# Patient Record
Sex: Male | Born: 1940 | Race: Black or African American | Hispanic: No | Marital: Married | Smoking: Never smoker
Health system: Southern US, Community
[De-identification: ages and names within clinical notes are randomized; demographics above are authoritative.]

## PROBLEM LIST (undated history)

## (undated) ENCOUNTER — Emergency Department: Discharge status: Absent without leave | Payer: Medicare Other

## (undated) HISTORY — PX: CATARACT EXTRACTION, BILATERAL: SHX1313

---

## 1999-05-06 ENCOUNTER — Encounter: Admission: RE | Admit: 1999-05-06 | Discharge: 1999-05-06 | Payer: Self-pay | Admitting: Internal Medicine

## 1999-05-06 ENCOUNTER — Encounter: Payer: Self-pay | Admitting: Internal Medicine

## 1999-05-23 ENCOUNTER — Ambulatory Visit (HOSPITAL_COMMUNITY): Admission: RE | Admit: 1999-05-23 | Discharge: 1999-05-23 | Payer: Self-pay | Admitting: Gastroenterology

## 1999-07-14 ENCOUNTER — Encounter (HOSPITAL_BASED_OUTPATIENT_CLINIC_OR_DEPARTMENT_OTHER): Payer: Self-pay | Admitting: General Surgery

## 1999-07-18 ENCOUNTER — Inpatient Hospital Stay (HOSPITAL_COMMUNITY): Admission: RE | Admit: 1999-07-18 | Discharge: 1999-08-02 | Payer: Self-pay | Admitting: General Surgery

## 1999-07-21 ENCOUNTER — Encounter (HOSPITAL_BASED_OUTPATIENT_CLINIC_OR_DEPARTMENT_OTHER): Payer: Self-pay | Admitting: General Surgery

## 1999-07-23 ENCOUNTER — Encounter (HOSPITAL_BASED_OUTPATIENT_CLINIC_OR_DEPARTMENT_OTHER): Payer: Self-pay | Admitting: General Surgery

## 1999-07-24 ENCOUNTER — Encounter (HOSPITAL_BASED_OUTPATIENT_CLINIC_OR_DEPARTMENT_OTHER): Payer: Self-pay | Admitting: General Surgery

## 1999-07-25 ENCOUNTER — Encounter (HOSPITAL_BASED_OUTPATIENT_CLINIC_OR_DEPARTMENT_OTHER): Payer: Self-pay | Admitting: General Surgery

## 1999-07-29 ENCOUNTER — Encounter (HOSPITAL_BASED_OUTPATIENT_CLINIC_OR_DEPARTMENT_OTHER): Payer: Self-pay | Admitting: General Surgery

## 1999-08-01 ENCOUNTER — Encounter (HOSPITAL_BASED_OUTPATIENT_CLINIC_OR_DEPARTMENT_OTHER): Payer: Self-pay | Admitting: General Surgery

## 2003-12-16 ENCOUNTER — Ambulatory Visit (HOSPITAL_COMMUNITY): Admission: RE | Admit: 2003-12-16 | Discharge: 2003-12-16 | Payer: Self-pay | Admitting: Gastroenterology

## 2005-05-22 ENCOUNTER — Emergency Department (HOSPITAL_COMMUNITY): Admission: EM | Admit: 2005-05-22 | Discharge: 2005-05-23 | Payer: Self-pay | Admitting: Emergency Medicine

## 2010-05-30 ENCOUNTER — Emergency Department (HOSPITAL_COMMUNITY)
Admission: EM | Admit: 2010-05-30 | Discharge: 2010-05-31 | Disposition: A | Discharge status: Home / Self Care | Payer: Medicare Other | Attending: Emergency Medicine | Admitting: Emergency Medicine

## 2010-05-30 ENCOUNTER — Other Ambulatory Visit: Payer: Self-pay | Admitting: Internal Medicine

## 2010-05-30 DIAGNOSIS — R059 Cough, unspecified: Secondary | ICD-10-CM | POA: Insufficient documentation

## 2010-05-30 DIAGNOSIS — J3489 Other specified disorders of nose and nasal sinuses: Secondary | ICD-10-CM | POA: Insufficient documentation

## 2010-05-30 DIAGNOSIS — E785 Hyperlipidemia, unspecified: Secondary | ICD-10-CM | POA: Insufficient documentation

## 2010-05-30 DIAGNOSIS — R791 Abnormal coagulation profile: Secondary | ICD-10-CM | POA: Insufficient documentation

## 2010-05-30 DIAGNOSIS — F29 Unspecified psychosis not due to a substance or known physiological condition: Secondary | ICD-10-CM | POA: Insufficient documentation

## 2010-05-30 DIAGNOSIS — R05 Cough: Secondary | ICD-10-CM | POA: Insufficient documentation

## 2010-05-30 DIAGNOSIS — E039 Hypothyroidism, unspecified: Secondary | ICD-10-CM | POA: Insufficient documentation

## 2010-05-30 DIAGNOSIS — R079 Chest pain, unspecified: Secondary | ICD-10-CM | POA: Insufficient documentation

## 2010-05-30 DIAGNOSIS — E119 Type 2 diabetes mellitus without complications: Secondary | ICD-10-CM | POA: Insufficient documentation

## 2010-05-30 DIAGNOSIS — I10 Essential (primary) hypertension: Secondary | ICD-10-CM | POA: Insufficient documentation

## 2010-05-30 DIAGNOSIS — Z79899 Other long term (current) drug therapy: Secondary | ICD-10-CM | POA: Insufficient documentation

## 2010-05-30 LAB — DIFFERENTIAL
Basophils Absolute: 0 10*3/uL (ref 0.0–0.1)
Basophils Relative: 0 % (ref 0–1)
Eosinophils Absolute: 0.1 10*3/uL (ref 0.0–0.7)
Eosinophils Relative: 1 % (ref 0–5)
Lymphocytes Relative: 22 % (ref 12–46)
Lymphs Abs: 2.7 10*3/uL (ref 0.7–4.0)
Monocytes Absolute: 1.3 10*3/uL — ABNORMAL HIGH (ref 0.1–1.0)
Monocytes Relative: 11 % (ref 3–12)
Neutro Abs: 7.8 10*3/uL — ABNORMAL HIGH (ref 1.7–7.7)
Neutrophils Relative %: 66 % (ref 43–77)

## 2010-05-30 LAB — POCT CARDIAC MARKERS
CKMB, poc: 1.2 ng/mL (ref 1.0–8.0)
Myoglobin, poc: 97.3 ng/mL (ref 12–200)
Troponin i, poc: <0.05 ng/mL (ref 0.00–0.09)

## 2010-05-30 LAB — CBC
HCT: 36.3 % — ABNORMAL LOW (ref 39.0–52.0)
Hemoglobin: 12.7 g/dL — ABNORMAL LOW (ref 13.0–17.0)
MCH: 30.6 pg (ref 26.0–34.0)
MCHC: 35 g/dL (ref 30.0–36.0)
MCV: 87.5 fL (ref 78.0–100.0)
Platelets: 318 10*3/uL (ref 150–400)
RBC: 4.15 MIL/uL — ABNORMAL LOW (ref 4.22–5.81)
RDW: 11.6 % (ref 11.5–15.5)
WBC: 11.8 10*3/uL — ABNORMAL HIGH (ref 4.0–10.5)

## 2010-05-30 LAB — D-DIMER, QUANTITATIVE: D-Dimer, Quant: 7.85 ug/mL-FEU — ABNORMAL HIGH (ref 0.00–0.48)

## 2010-05-31 ENCOUNTER — Emergency Department (HOSPITAL_COMMUNITY): Payer: Medicare Other

## 2010-05-31 ENCOUNTER — Encounter (HOSPITAL_COMMUNITY): Payer: Self-pay | Admitting: Radiology

## 2010-05-31 LAB — BASIC METABOLIC PANEL WITH GFR
BUN: 12 mg/dL (ref 6–23)
CO2: 26 meq/L (ref 19–32)
Calcium: 9.2 mg/dL (ref 8.4–10.5)
Chloride: 97 meq/L (ref 96–112)
Creatinine, Ser: 1.11 mg/dL (ref 0.4–1.5)
GFR calc non Af Amer: >60 mL/min
Glucose, Bld: 157 mg/dL — ABNORMAL HIGH (ref 70–99)
Potassium: 3.8 meq/L (ref 3.5–5.1)
Sodium: 132 meq/L — ABNORMAL LOW (ref 135–145)

## 2010-05-31 MED ORDER — IOHEXOL 300 MG/ML  SOLN
100.0000 mL | Freq: Once | INTRAMUSCULAR | Status: AC | PRN
  Administered 2010-05-31: 85 mL via INTRAVENOUS

## 2011-05-22 DIAGNOSIS — H359 Unspecified retinal disorder: Secondary | ICD-10-CM | POA: Diagnosis not present

## 2011-05-22 DIAGNOSIS — E11311 Type 2 diabetes mellitus with unspecified diabetic retinopathy with macular edema: Secondary | ICD-10-CM | POA: Diagnosis not present

## 2011-05-22 DIAGNOSIS — E1139 Type 2 diabetes mellitus with other diabetic ophthalmic complication: Secondary | ICD-10-CM | POA: Diagnosis not present

## 2011-05-22 DIAGNOSIS — E11339 Type 2 diabetes mellitus with moderate nonproliferative diabetic retinopathy without macular edema: Secondary | ICD-10-CM | POA: Diagnosis not present

## 2011-07-03 DIAGNOSIS — E1139 Type 2 diabetes mellitus with other diabetic ophthalmic complication: Secondary | ICD-10-CM | POA: Diagnosis not present

## 2011-07-03 DIAGNOSIS — H2510 Age-related nuclear cataract, unspecified eye: Secondary | ICD-10-CM | POA: Diagnosis not present

## 2011-07-03 DIAGNOSIS — H52209 Unspecified astigmatism, unspecified eye: Secondary | ICD-10-CM | POA: Diagnosis not present

## 2011-07-03 DIAGNOSIS — E11319 Type 2 diabetes mellitus with unspecified diabetic retinopathy without macular edema: Secondary | ICD-10-CM | POA: Diagnosis not present

## 2011-07-10 DIAGNOSIS — E039 Hypothyroidism, unspecified: Secondary | ICD-10-CM | POA: Diagnosis not present

## 2011-07-10 DIAGNOSIS — E119 Type 2 diabetes mellitus without complications: Secondary | ICD-10-CM | POA: Diagnosis not present

## 2011-07-10 DIAGNOSIS — I10 Essential (primary) hypertension: Secondary | ICD-10-CM | POA: Diagnosis not present

## 2011-09-18 DIAGNOSIS — E782 Mixed hyperlipidemia: Secondary | ICD-10-CM | POA: Diagnosis not present

## 2011-09-18 DIAGNOSIS — Z1331 Encounter for screening for depression: Secondary | ICD-10-CM | POA: Diagnosis not present

## 2011-09-18 DIAGNOSIS — E039 Hypothyroidism, unspecified: Secondary | ICD-10-CM | POA: Diagnosis not present

## 2011-09-18 DIAGNOSIS — Z79899 Other long term (current) drug therapy: Secondary | ICD-10-CM | POA: Diagnosis not present

## 2011-09-18 DIAGNOSIS — E11339 Type 2 diabetes mellitus with moderate nonproliferative diabetic retinopathy without macular edema: Secondary | ICD-10-CM | POA: Diagnosis not present

## 2011-09-18 DIAGNOSIS — E1139 Type 2 diabetes mellitus with other diabetic ophthalmic complication: Secondary | ICD-10-CM | POA: Diagnosis not present

## 2011-09-18 DIAGNOSIS — J309 Allergic rhinitis, unspecified: Secondary | ICD-10-CM | POA: Diagnosis not present

## 2011-09-18 DIAGNOSIS — Z0000 Encounter for general adult medical examination without abnormal findings: Secondary | ICD-10-CM | POA: Diagnosis not present

## 2011-09-18 DIAGNOSIS — E538 Deficiency of other specified B group vitamins: Secondary | ICD-10-CM | POA: Diagnosis not present

## 2011-09-18 DIAGNOSIS — E1165 Type 2 diabetes mellitus with hyperglycemia: Secondary | ICD-10-CM | POA: Diagnosis not present

## 2011-09-18 DIAGNOSIS — E559 Vitamin D deficiency, unspecified: Secondary | ICD-10-CM | POA: Diagnosis not present

## 2012-02-12 DIAGNOSIS — E1139 Type 2 diabetes mellitus with other diabetic ophthalmic complication: Secondary | ICD-10-CM | POA: Diagnosis not present

## 2012-02-12 DIAGNOSIS — E11311 Type 2 diabetes mellitus with unspecified diabetic retinopathy with macular edema: Secondary | ICD-10-CM | POA: Diagnosis not present

## 2012-02-12 DIAGNOSIS — H35359 Cystoid macular degeneration, unspecified eye: Secondary | ICD-10-CM | POA: Diagnosis not present

## 2012-02-12 DIAGNOSIS — H359 Unspecified retinal disorder: Secondary | ICD-10-CM | POA: Diagnosis not present

## 2012-07-03 DIAGNOSIS — E1139 Type 2 diabetes mellitus with other diabetic ophthalmic complication: Secondary | ICD-10-CM | POA: Diagnosis not present

## 2012-07-03 DIAGNOSIS — H2510 Age-related nuclear cataract, unspecified eye: Secondary | ICD-10-CM | POA: Diagnosis not present

## 2012-07-03 DIAGNOSIS — H52209 Unspecified astigmatism, unspecified eye: Secondary | ICD-10-CM | POA: Diagnosis not present

## 2012-07-03 DIAGNOSIS — E11319 Type 2 diabetes mellitus with unspecified diabetic retinopathy without macular edema: Secondary | ICD-10-CM | POA: Diagnosis not present

## 2012-08-28 IMAGING — CT CT ANGIO CHEST
2 of 7 series · 19 of 36 positions shown · IV contrast (APPLIED)
Comparison: Plain film chest 05/23/2005.

CLINICAL DATA: Cough.  Elevated D-dimer.

CT ANGIOGRAPHY CHEST WITH CONTRAST
TECHNIQUE: Multidetector CT imaging of the chest was performed
using the standard protocol during bolus administration of
intravenous contrast.  Multiplanar CT image reconstructions
including MIPs were obtained to evaluate the vascular anatomy.
Contrast:  100 ml [id number].

[Series 8: pulm embolism 1.0 b25f thins · axial · 0.63mm/px · z∈[+16,+271]mm · 18 of 285 slices shown]
[im 15/285  lung]
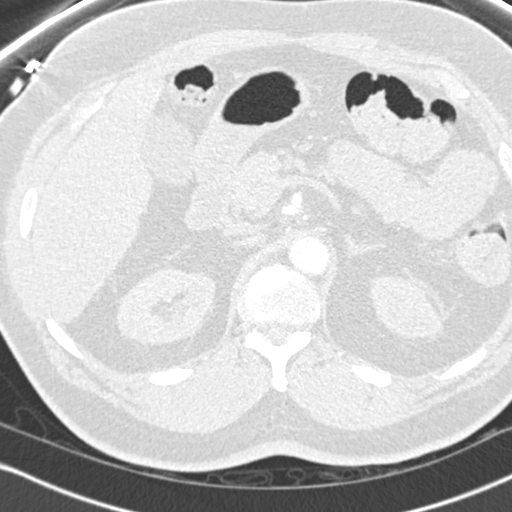
[im 29/285  mediastinal]
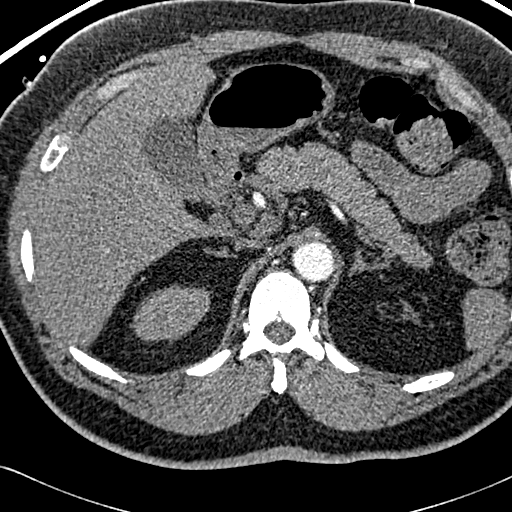
[im 43/285  lung]
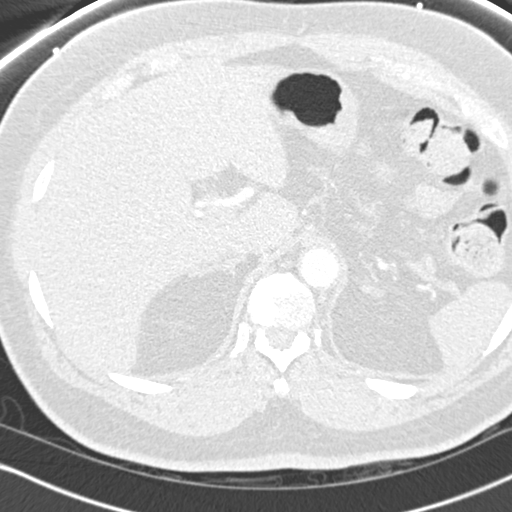
[im 57/285  mediastinal]
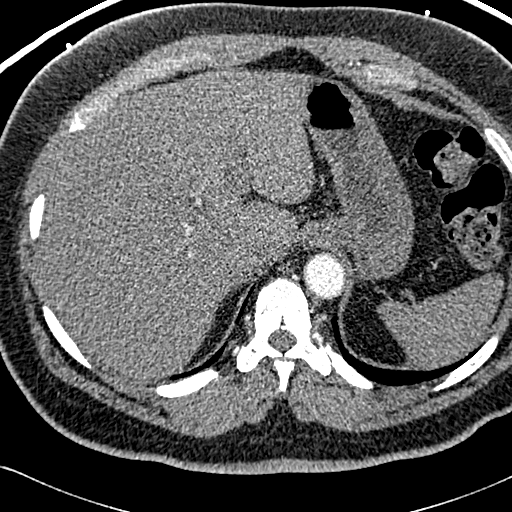
[im 72/285  lung]
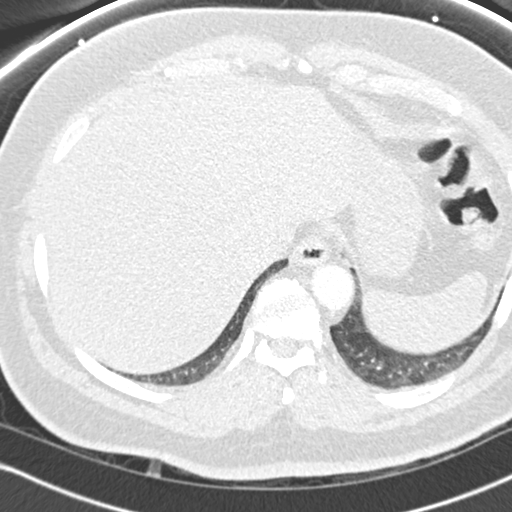
[im 86/285  mediastinal]
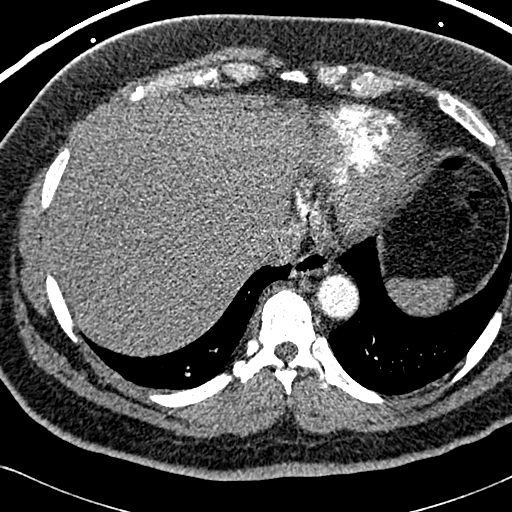
[im 100/285  lung]
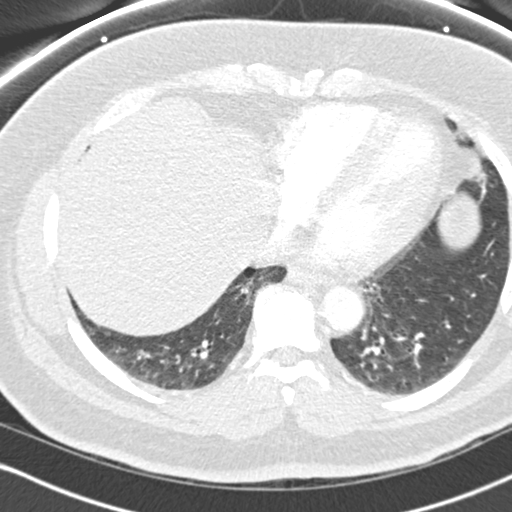
[im 114/285  mediastinal]
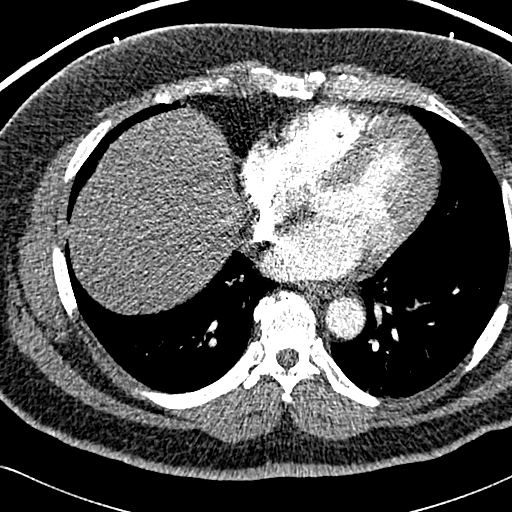
[im 128/285  lung]
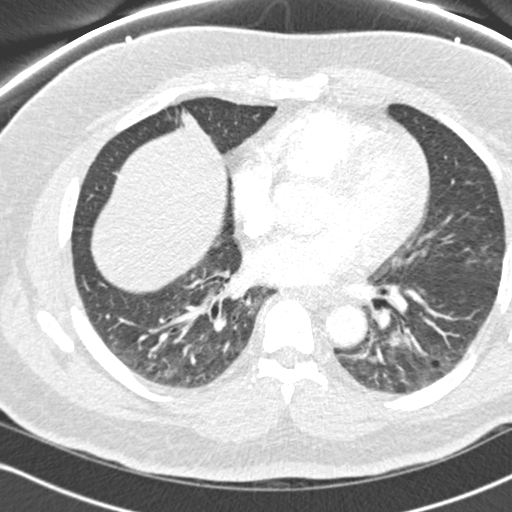
[im 157/285  mediastinal]
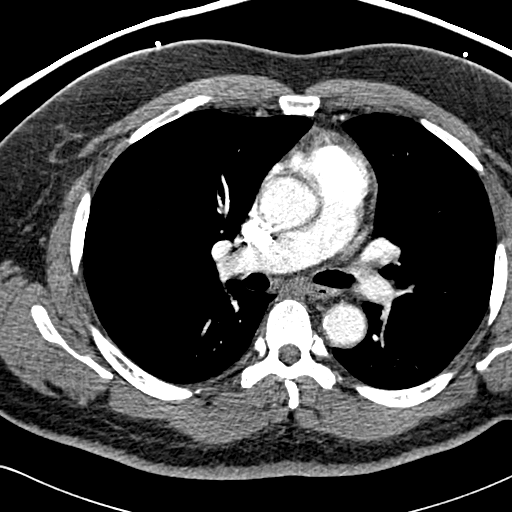
[im 171/285  lung]
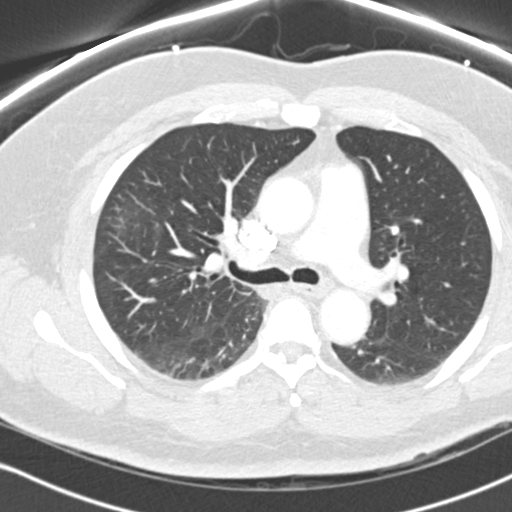
[im 185/285  mediastinal]
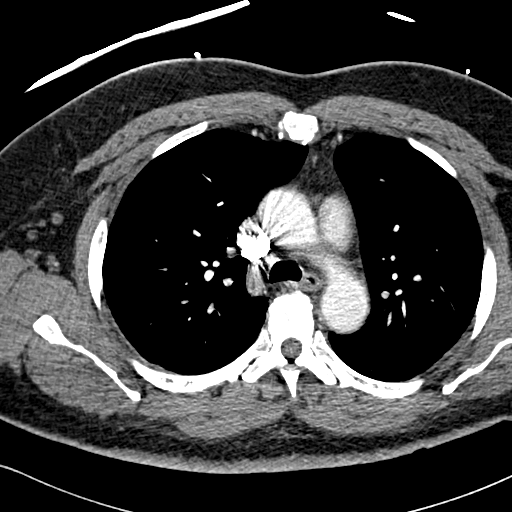
[im 199/285  lung]
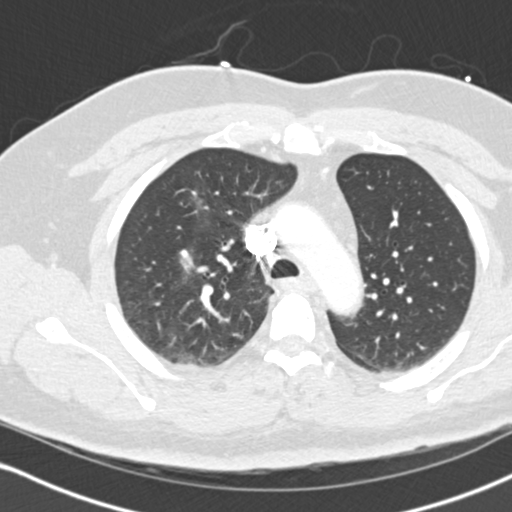
[im 214/285  mediastinal]
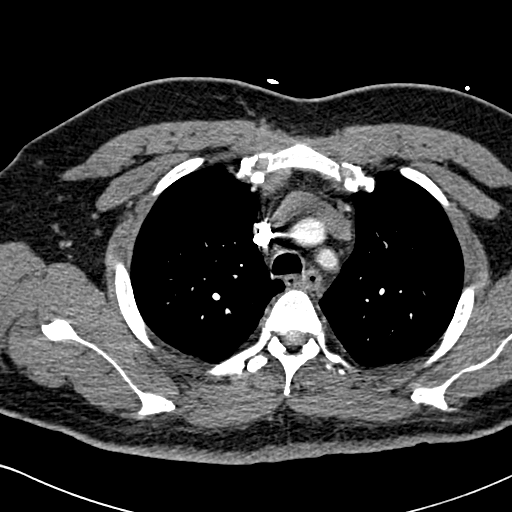
[im 228/285  lung]
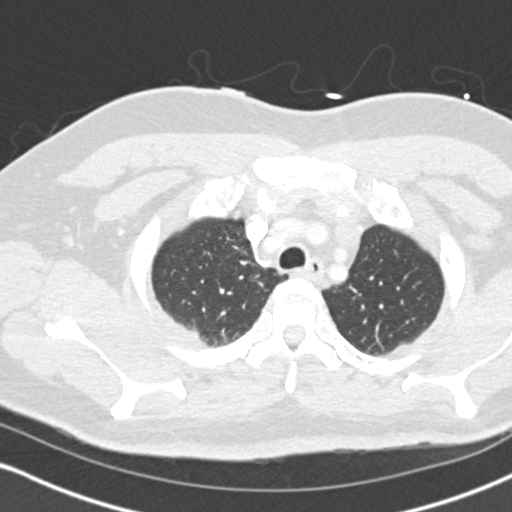
[im 242/285  mediastinal]
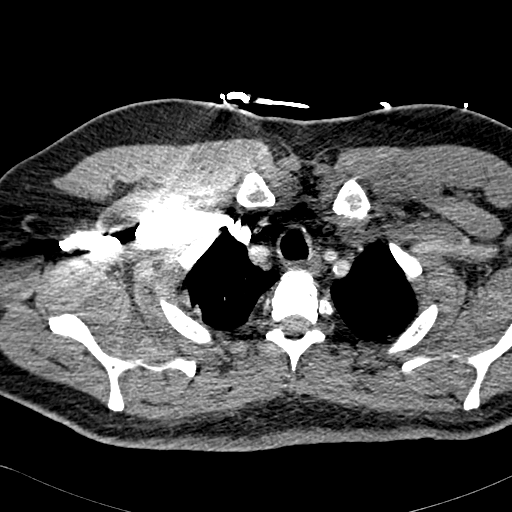
[im 256/285  lung]
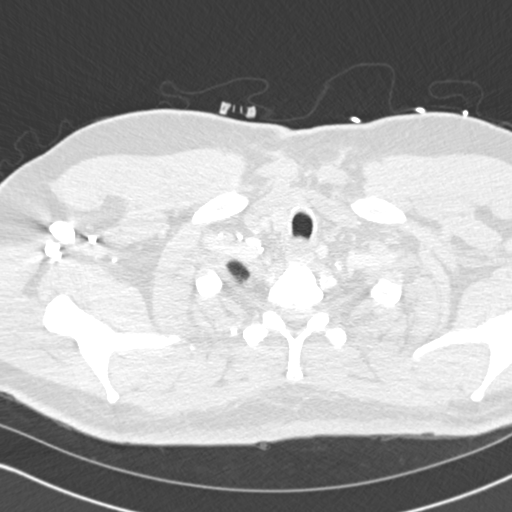
[im 270/285  mediastinal]
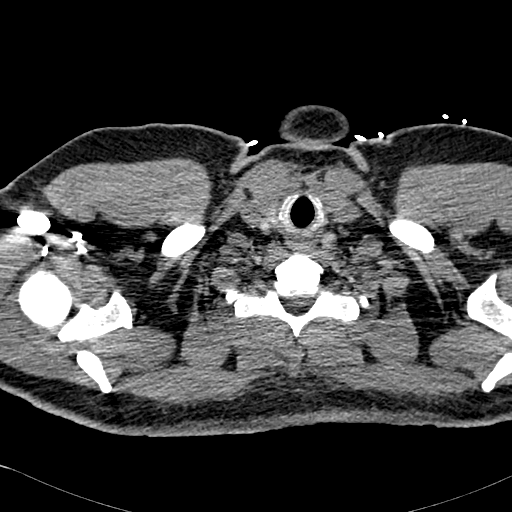

[Series 602: cor · coronal · 0.63mm/px · 1 of 106 slices shown]
[im 53/106  mediastinal]
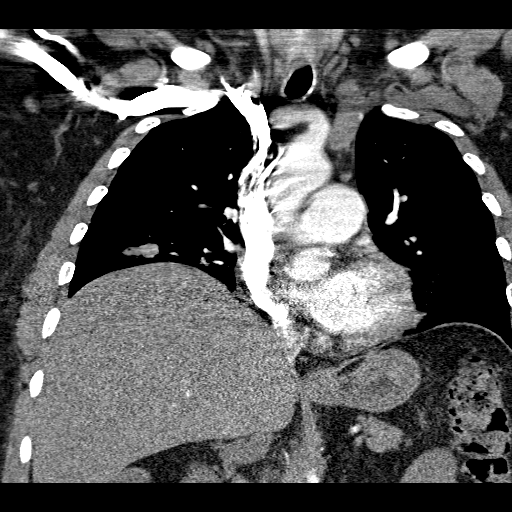

[19 of 36 positions shown; findings below may reference images not displayed]

FINDINGS: No pulmonary embolus is identified.  No pleural or
pericardial effusion.  No axillary, hilar or mediastinal
lymphadenopathy.  Heart size normal.  Lungs demonstrate a 0.5 cm
nodule the right upper lobe on image 31.  There is also some patchy
airspace disease in the right upper lobe.  Dependent atelectasis is
identified.  Mucoid impaction of a bronchus in the right middle
lobe is seen.

Incidentally imaged upper abdomen is unremarkable. No focal bony
abnormality.

Review of the MIP images confirms the above findings.
IMPRESSION: 1.  Negative for pulmonary embolus.
2.  Patchy right upper lobe airspace disease worrisome for
bronchopneumonia.
3.  0.5 cm right upper lobe pulmonary nodule. If the patient is at
high risk for bronchogenic carcinoma, follow-up chest CT at 6-12
months is recommended.  If the patient is at low risk for
bronchogenic carcinoma, follow-up chest CT at 12 months is
recommended.  This recommendation follows the consensus statement:
Guidelines for Management of Small Pulmonary Nodules Detected on CT
Scans: A Statement from the [HOSPITAL] as published in
[URL]
4.  Mucoid impaction of a right middle lobe bronchus noted.

## 2012-09-17 DIAGNOSIS — E1139 Type 2 diabetes mellitus with other diabetic ophthalmic complication: Secondary | ICD-10-CM | POA: Diagnosis not present

## 2012-09-17 DIAGNOSIS — E11311 Type 2 diabetes mellitus with unspecified diabetic retinopathy with macular edema: Secondary | ICD-10-CM | POA: Diagnosis not present

## 2012-09-17 DIAGNOSIS — E11339 Type 2 diabetes mellitus with moderate nonproliferative diabetic retinopathy without macular edema: Secondary | ICD-10-CM | POA: Diagnosis not present

## 2012-09-17 DIAGNOSIS — H35359 Cystoid macular degeneration, unspecified eye: Secondary | ICD-10-CM | POA: Diagnosis not present

## 2012-09-24 DIAGNOSIS — E538 Deficiency of other specified B group vitamins: Secondary | ICD-10-CM | POA: Diagnosis not present

## 2012-09-24 DIAGNOSIS — Z1331 Encounter for screening for depression: Secondary | ICD-10-CM | POA: Diagnosis not present

## 2012-09-24 DIAGNOSIS — E1165 Type 2 diabetes mellitus with hyperglycemia: Secondary | ICD-10-CM | POA: Diagnosis not present

## 2012-09-24 DIAGNOSIS — J309 Allergic rhinitis, unspecified: Secondary | ICD-10-CM | POA: Diagnosis not present

## 2012-09-24 DIAGNOSIS — E782 Mixed hyperlipidemia: Secondary | ICD-10-CM | POA: Diagnosis not present

## 2012-09-24 DIAGNOSIS — I10 Essential (primary) hypertension: Secondary | ICD-10-CM | POA: Diagnosis not present

## 2012-09-24 DIAGNOSIS — E11339 Type 2 diabetes mellitus with moderate nonproliferative diabetic retinopathy without macular edema: Secondary | ICD-10-CM | POA: Diagnosis not present

## 2012-09-24 DIAGNOSIS — Z0000 Encounter for general adult medical examination without abnormal findings: Secondary | ICD-10-CM | POA: Diagnosis not present

## 2012-09-24 DIAGNOSIS — Z79899 Other long term (current) drug therapy: Secondary | ICD-10-CM | POA: Diagnosis not present

## 2012-09-24 DIAGNOSIS — E1139 Type 2 diabetes mellitus with other diabetic ophthalmic complication: Secondary | ICD-10-CM | POA: Diagnosis not present

## 2012-09-24 DIAGNOSIS — E039 Hypothyroidism, unspecified: Secondary | ICD-10-CM | POA: Diagnosis not present

## 2012-09-24 DIAGNOSIS — E559 Vitamin D deficiency, unspecified: Secondary | ICD-10-CM | POA: Diagnosis not present

## 2012-09-24 DIAGNOSIS — E669 Obesity, unspecified: Secondary | ICD-10-CM | POA: Diagnosis not present

## 2012-10-23 ENCOUNTER — Other Ambulatory Visit: Payer: Self-pay | Admitting: Gastroenterology

## 2012-10-23 DIAGNOSIS — Z86010 Personal history of colonic polyps: Secondary | ICD-10-CM | POA: Diagnosis not present

## 2012-10-23 DIAGNOSIS — K620 Anal polyp: Secondary | ICD-10-CM | POA: Diagnosis not present

## 2012-10-23 DIAGNOSIS — D126 Benign neoplasm of colon, unspecified: Secondary | ICD-10-CM | POA: Diagnosis not present

## 2012-10-23 DIAGNOSIS — K5730 Diverticulosis of large intestine without perforation or abscess without bleeding: Secondary | ICD-10-CM | POA: Diagnosis not present

## 2012-10-23 DIAGNOSIS — Z09 Encounter for follow-up examination after completed treatment for conditions other than malignant neoplasm: Secondary | ICD-10-CM | POA: Diagnosis not present

## 2012-10-23 DIAGNOSIS — K621 Rectal polyp: Secondary | ICD-10-CM | POA: Diagnosis not present

## 2013-04-17 DIAGNOSIS — E11311 Type 2 diabetes mellitus with unspecified diabetic retinopathy with macular edema: Secondary | ICD-10-CM | POA: Diagnosis not present

## 2013-04-17 DIAGNOSIS — E1139 Type 2 diabetes mellitus with other diabetic ophthalmic complication: Secondary | ICD-10-CM | POA: Diagnosis not present

## 2013-04-17 DIAGNOSIS — H359 Unspecified retinal disorder: Secondary | ICD-10-CM | POA: Diagnosis not present

## 2013-04-17 DIAGNOSIS — H35359 Cystoid macular degeneration, unspecified eye: Secondary | ICD-10-CM | POA: Diagnosis not present

## 2013-04-17 DIAGNOSIS — E11339 Type 2 diabetes mellitus with moderate nonproliferative diabetic retinopathy without macular edema: Secondary | ICD-10-CM | POA: Diagnosis not present

## 2013-07-08 DIAGNOSIS — E1139 Type 2 diabetes mellitus with other diabetic ophthalmic complication: Secondary | ICD-10-CM | POA: Diagnosis not present

## 2013-07-08 DIAGNOSIS — H2510 Age-related nuclear cataract, unspecified eye: Secondary | ICD-10-CM | POA: Diagnosis not present

## 2013-07-08 DIAGNOSIS — H52209 Unspecified astigmatism, unspecified eye: Secondary | ICD-10-CM | POA: Diagnosis not present

## 2013-07-08 DIAGNOSIS — E11311 Type 2 diabetes mellitus with unspecified diabetic retinopathy with macular edema: Secondary | ICD-10-CM | POA: Diagnosis not present

## 2013-08-15 DIAGNOSIS — R142 Eructation: Secondary | ICD-10-CM | POA: Diagnosis not present

## 2013-08-15 DIAGNOSIS — R141 Gas pain: Secondary | ICD-10-CM | POA: Diagnosis not present

## 2013-11-04 DIAGNOSIS — E1165 Type 2 diabetes mellitus with hyperglycemia: Secondary | ICD-10-CM | POA: Diagnosis not present

## 2013-11-04 DIAGNOSIS — E039 Hypothyroidism, unspecified: Secondary | ICD-10-CM | POA: Diagnosis not present

## 2013-11-04 DIAGNOSIS — Z0000 Encounter for general adult medical examination without abnormal findings: Secondary | ICD-10-CM | POA: Diagnosis not present

## 2013-11-04 DIAGNOSIS — E1139 Type 2 diabetes mellitus with other diabetic ophthalmic complication: Secondary | ICD-10-CM | POA: Diagnosis not present

## 2013-11-04 DIAGNOSIS — Z1331 Encounter for screening for depression: Secondary | ICD-10-CM | POA: Diagnosis not present

## 2013-11-04 DIAGNOSIS — Z23 Encounter for immunization: Secondary | ICD-10-CM | POA: Diagnosis not present

## 2013-11-04 DIAGNOSIS — Z6833 Body mass index (BMI) 33.0-33.9, adult: Secondary | ICD-10-CM | POA: Diagnosis not present

## 2013-11-04 DIAGNOSIS — E538 Deficiency of other specified B group vitamins: Secondary | ICD-10-CM | POA: Diagnosis not present

## 2013-11-04 DIAGNOSIS — E11339 Type 2 diabetes mellitus with moderate nonproliferative diabetic retinopathy without macular edema: Secondary | ICD-10-CM | POA: Diagnosis not present

## 2013-11-04 DIAGNOSIS — E782 Mixed hyperlipidemia: Secondary | ICD-10-CM | POA: Diagnosis not present

## 2013-11-04 DIAGNOSIS — E559 Vitamin D deficiency, unspecified: Secondary | ICD-10-CM | POA: Diagnosis not present

## 2013-11-04 DIAGNOSIS — N529 Male erectile dysfunction, unspecified: Secondary | ICD-10-CM | POA: Diagnosis not present

## 2013-11-04 DIAGNOSIS — I10 Essential (primary) hypertension: Secondary | ICD-10-CM | POA: Diagnosis not present

## 2013-11-06 DIAGNOSIS — H2510 Age-related nuclear cataract, unspecified eye: Secondary | ICD-10-CM | POA: Diagnosis not present

## 2013-11-06 DIAGNOSIS — H2589 Other age-related cataract: Secondary | ICD-10-CM | POA: Diagnosis not present

## 2013-12-04 DIAGNOSIS — H2589 Other age-related cataract: Secondary | ICD-10-CM | POA: Diagnosis not present

## 2013-12-04 DIAGNOSIS — H2510 Age-related nuclear cataract, unspecified eye: Secondary | ICD-10-CM | POA: Diagnosis not present

## 2013-12-12 DIAGNOSIS — H35351 Cystoid macular degeneration, right eye: Secondary | ICD-10-CM | POA: Diagnosis not present

## 2013-12-15 DIAGNOSIS — E11339 Type 2 diabetes mellitus with moderate nonproliferative diabetic retinopathy without macular edema: Secondary | ICD-10-CM | POA: Diagnosis not present

## 2013-12-15 DIAGNOSIS — E11359 Type 2 diabetes mellitus with proliferative diabetic retinopathy without macular edema: Secondary | ICD-10-CM | POA: Diagnosis not present

## 2013-12-20 DIAGNOSIS — Z23 Encounter for immunization: Secondary | ICD-10-CM | POA: Diagnosis not present

## 2014-01-29 DIAGNOSIS — E11311 Type 2 diabetes mellitus with unspecified diabetic retinopathy with macular edema: Secondary | ICD-10-CM | POA: Diagnosis not present

## 2014-03-05 DIAGNOSIS — M6248 Contracture of muscle, other site: Secondary | ICD-10-CM | POA: Diagnosis not present

## 2014-07-30 DIAGNOSIS — H35351 Cystoid macular degeneration, right eye: Secondary | ICD-10-CM | POA: Diagnosis not present

## 2014-07-30 DIAGNOSIS — E11339 Type 2 diabetes mellitus with moderate nonproliferative diabetic retinopathy without macular edema: Secondary | ICD-10-CM | POA: Diagnosis not present

## 2014-07-30 DIAGNOSIS — H359 Unspecified retinal disorder: Secondary | ICD-10-CM | POA: Diagnosis not present

## 2014-11-26 DIAGNOSIS — L309 Dermatitis, unspecified: Secondary | ICD-10-CM | POA: Diagnosis not present

## 2014-11-26 DIAGNOSIS — Z79899 Other long term (current) drug therapy: Secondary | ICD-10-CM | POA: Diagnosis not present

## 2014-11-26 DIAGNOSIS — E559 Vitamin D deficiency, unspecified: Secondary | ICD-10-CM | POA: Diagnosis not present

## 2014-11-26 DIAGNOSIS — E039 Hypothyroidism, unspecified: Secondary | ICD-10-CM | POA: Diagnosis not present

## 2014-11-26 DIAGNOSIS — Z23 Encounter for immunization: Secondary | ICD-10-CM | POA: Diagnosis not present

## 2014-11-26 DIAGNOSIS — Z0001 Encounter for general adult medical examination with abnormal findings: Secondary | ICD-10-CM | POA: Diagnosis not present

## 2014-11-26 DIAGNOSIS — E11331 Type 2 diabetes mellitus with moderate nonproliferative diabetic retinopathy with macular edema: Secondary | ICD-10-CM | POA: Diagnosis not present

## 2014-11-26 DIAGNOSIS — I10 Essential (primary) hypertension: Secondary | ICD-10-CM | POA: Diagnosis not present

## 2014-11-26 DIAGNOSIS — E1165 Type 2 diabetes mellitus with hyperglycemia: Secondary | ICD-10-CM | POA: Diagnosis not present

## 2014-11-26 DIAGNOSIS — D81819 Biotin-dependent carboxylase deficiency, unspecified: Secondary | ICD-10-CM | POA: Diagnosis not present

## 2014-11-26 DIAGNOSIS — Z1389 Encounter for screening for other disorder: Secondary | ICD-10-CM | POA: Diagnosis not present

## 2014-11-26 DIAGNOSIS — E782 Mixed hyperlipidemia: Secondary | ICD-10-CM | POA: Diagnosis not present

## 2015-04-14 DIAGNOSIS — E113493 Type 2 diabetes mellitus with severe nonproliferative diabetic retinopathy without macular edema, bilateral: Secondary | ICD-10-CM | POA: Diagnosis not present

## 2015-04-14 DIAGNOSIS — H35043 Retinal micro-aneurysms, unspecified, bilateral: Secondary | ICD-10-CM | POA: Diagnosis not present

## 2015-04-14 DIAGNOSIS — H3563 Retinal hemorrhage, bilateral: Secondary | ICD-10-CM | POA: Diagnosis not present

## 2015-04-14 DIAGNOSIS — H359 Unspecified retinal disorder: Secondary | ICD-10-CM | POA: Diagnosis not present

## 2015-07-12 DIAGNOSIS — Z961 Presence of intraocular lens: Secondary | ICD-10-CM | POA: Diagnosis not present

## 2015-07-12 DIAGNOSIS — E113393 Type 2 diabetes mellitus with moderate nonproliferative diabetic retinopathy without macular edema, bilateral: Secondary | ICD-10-CM | POA: Diagnosis not present

## 2015-07-12 DIAGNOSIS — H52203 Unspecified astigmatism, bilateral: Secondary | ICD-10-CM | POA: Diagnosis not present

## 2015-11-04 DIAGNOSIS — R21 Rash and other nonspecific skin eruption: Secondary | ICD-10-CM | POA: Diagnosis not present

## 2015-12-06 DIAGNOSIS — Z23 Encounter for immunization: Secondary | ICD-10-CM | POA: Diagnosis not present

## 2015-12-06 DIAGNOSIS — L309 Dermatitis, unspecified: Secondary | ICD-10-CM | POA: Diagnosis not present

## 2015-12-06 DIAGNOSIS — D81819 Biotin-dependent carboxylase deficiency, unspecified: Secondary | ICD-10-CM | POA: Diagnosis not present

## 2015-12-06 DIAGNOSIS — Z79899 Other long term (current) drug therapy: Secondary | ICD-10-CM | POA: Diagnosis not present

## 2015-12-06 DIAGNOSIS — Z7984 Long term (current) use of oral hypoglycemic drugs: Secondary | ICD-10-CM | POA: Diagnosis not present

## 2015-12-06 DIAGNOSIS — E559 Vitamin D deficiency, unspecified: Secondary | ICD-10-CM | POA: Diagnosis not present

## 2015-12-06 DIAGNOSIS — E039 Hypothyroidism, unspecified: Secondary | ICD-10-CM | POA: Diagnosis not present

## 2015-12-06 DIAGNOSIS — Z1389 Encounter for screening for other disorder: Secondary | ICD-10-CM | POA: Diagnosis not present

## 2015-12-06 DIAGNOSIS — Z0001 Encounter for general adult medical examination with abnormal findings: Secondary | ICD-10-CM | POA: Diagnosis not present

## 2015-12-06 DIAGNOSIS — E1165 Type 2 diabetes mellitus with hyperglycemia: Secondary | ICD-10-CM | POA: Diagnosis not present

## 2015-12-06 DIAGNOSIS — N529 Male erectile dysfunction, unspecified: Secondary | ICD-10-CM | POA: Diagnosis not present

## 2015-12-06 DIAGNOSIS — E782 Mixed hyperlipidemia: Secondary | ICD-10-CM | POA: Diagnosis not present

## 2015-12-06 DIAGNOSIS — I10 Essential (primary) hypertension: Secondary | ICD-10-CM | POA: Diagnosis not present

## 2016-01-12 DIAGNOSIS — H3563 Retinal hemorrhage, bilateral: Secondary | ICD-10-CM | POA: Diagnosis not present

## 2016-01-12 DIAGNOSIS — E113493 Type 2 diabetes mellitus with severe nonproliferative diabetic retinopathy without macular edema, bilateral: Secondary | ICD-10-CM | POA: Diagnosis not present

## 2016-01-12 DIAGNOSIS — H35043 Retinal micro-aneurysms, unspecified, bilateral: Secondary | ICD-10-CM | POA: Diagnosis not present

## 2016-01-12 DIAGNOSIS — H359 Unspecified retinal disorder: Secondary | ICD-10-CM | POA: Diagnosis not present

## 2016-03-09 DIAGNOSIS — R062 Wheezing: Secondary | ICD-10-CM | POA: Diagnosis not present

## 2016-03-09 DIAGNOSIS — J22 Unspecified acute lower respiratory infection: Secondary | ICD-10-CM | POA: Diagnosis not present

## 2016-04-06 DIAGNOSIS — J3089 Other allergic rhinitis: Secondary | ICD-10-CM | POA: Diagnosis not present

## 2016-06-20 DIAGNOSIS — S335XXA Sprain of ligaments of lumbar spine, initial encounter: Secondary | ICD-10-CM | POA: Diagnosis not present

## 2016-06-20 DIAGNOSIS — M9904 Segmental and somatic dysfunction of sacral region: Secondary | ICD-10-CM | POA: Diagnosis not present

## 2016-06-21 DIAGNOSIS — S335XXA Sprain of ligaments of lumbar spine, initial encounter: Secondary | ICD-10-CM | POA: Diagnosis not present

## 2016-06-21 DIAGNOSIS — M9904 Segmental and somatic dysfunction of sacral region: Secondary | ICD-10-CM | POA: Diagnosis not present

## 2016-06-23 DIAGNOSIS — S335XXA Sprain of ligaments of lumbar spine, initial encounter: Secondary | ICD-10-CM | POA: Diagnosis not present

## 2016-06-23 DIAGNOSIS — M9904 Segmental and somatic dysfunction of sacral region: Secondary | ICD-10-CM | POA: Diagnosis not present

## 2016-06-26 DIAGNOSIS — M9904 Segmental and somatic dysfunction of sacral region: Secondary | ICD-10-CM | POA: Diagnosis not present

## 2016-06-26 DIAGNOSIS — S335XXA Sprain of ligaments of lumbar spine, initial encounter: Secondary | ICD-10-CM | POA: Diagnosis not present

## 2016-06-30 DIAGNOSIS — S335XXA Sprain of ligaments of lumbar spine, initial encounter: Secondary | ICD-10-CM | POA: Diagnosis not present

## 2016-06-30 DIAGNOSIS — M9904 Segmental and somatic dysfunction of sacral region: Secondary | ICD-10-CM | POA: Diagnosis not present

## 2016-07-18 DIAGNOSIS — H52203 Unspecified astigmatism, bilateral: Secondary | ICD-10-CM | POA: Diagnosis not present

## 2016-07-18 DIAGNOSIS — Z961 Presence of intraocular lens: Secondary | ICD-10-CM | POA: Diagnosis not present

## 2016-07-18 DIAGNOSIS — E113213 Type 2 diabetes mellitus with mild nonproliferative diabetic retinopathy with macular edema, bilateral: Secondary | ICD-10-CM | POA: Diagnosis not present

## 2016-07-28 DIAGNOSIS — S335XXA Sprain of ligaments of lumbar spine, initial encounter: Secondary | ICD-10-CM | POA: Diagnosis not present

## 2016-07-28 DIAGNOSIS — M9904 Segmental and somatic dysfunction of sacral region: Secondary | ICD-10-CM | POA: Diagnosis not present

## 2016-10-18 DIAGNOSIS — H359 Unspecified retinal disorder: Secondary | ICD-10-CM | POA: Diagnosis not present

## 2016-10-18 DIAGNOSIS — H3563 Retinal hemorrhage, bilateral: Secondary | ICD-10-CM | POA: Diagnosis not present

## 2016-10-18 DIAGNOSIS — H35043 Retinal micro-aneurysms, unspecified, bilateral: Secondary | ICD-10-CM | POA: Diagnosis not present

## 2016-10-18 DIAGNOSIS — E113493 Type 2 diabetes mellitus with severe nonproliferative diabetic retinopathy without macular edema, bilateral: Secondary | ICD-10-CM | POA: Diagnosis not present

## 2017-01-16 DIAGNOSIS — I10 Essential (primary) hypertension: Secondary | ICD-10-CM | POA: Diagnosis not present

## 2017-01-16 DIAGNOSIS — J309 Allergic rhinitis, unspecified: Secondary | ICD-10-CM | POA: Diagnosis not present

## 2017-01-16 DIAGNOSIS — E039 Hypothyroidism, unspecified: Secondary | ICD-10-CM | POA: Diagnosis not present

## 2017-01-16 DIAGNOSIS — D81819 Biotin-dependent carboxylase deficiency, unspecified: Secondary | ICD-10-CM | POA: Diagnosis not present

## 2017-01-16 DIAGNOSIS — Z23 Encounter for immunization: Secondary | ICD-10-CM | POA: Diagnosis not present

## 2017-01-16 DIAGNOSIS — Z0000 Encounter for general adult medical examination without abnormal findings: Secondary | ICD-10-CM | POA: Diagnosis not present

## 2017-01-16 DIAGNOSIS — Z79899 Other long term (current) drug therapy: Secondary | ICD-10-CM | POA: Diagnosis not present

## 2017-01-16 DIAGNOSIS — E782 Mixed hyperlipidemia: Secondary | ICD-10-CM | POA: Diagnosis not present

## 2017-01-16 DIAGNOSIS — Z7984 Long term (current) use of oral hypoglycemic drugs: Secondary | ICD-10-CM | POA: Diagnosis not present

## 2017-01-16 DIAGNOSIS — E559 Vitamin D deficiency, unspecified: Secondary | ICD-10-CM | POA: Diagnosis not present

## 2017-01-16 DIAGNOSIS — E113293 Type 2 diabetes mellitus with mild nonproliferative diabetic retinopathy without macular edema, bilateral: Secondary | ICD-10-CM | POA: Diagnosis not present

## 2017-01-16 DIAGNOSIS — Z1389 Encounter for screening for other disorder: Secondary | ICD-10-CM | POA: Diagnosis not present

## 2017-07-23 DIAGNOSIS — H3563 Retinal hemorrhage, bilateral: Secondary | ICD-10-CM | POA: Diagnosis not present

## 2017-07-23 DIAGNOSIS — E113493 Type 2 diabetes mellitus with severe nonproliferative diabetic retinopathy without macular edema, bilateral: Secondary | ICD-10-CM | POA: Diagnosis not present

## 2017-07-23 DIAGNOSIS — H359 Unspecified retinal disorder: Secondary | ICD-10-CM | POA: Diagnosis not present

## 2017-07-23 DIAGNOSIS — Z86010 Personal history of colonic polyps: Secondary | ICD-10-CM | POA: Diagnosis not present

## 2017-07-23 DIAGNOSIS — E039 Hypothyroidism, unspecified: Secondary | ICD-10-CM | POA: Diagnosis not present

## 2017-07-23 DIAGNOSIS — E119 Type 2 diabetes mellitus without complications: Secondary | ICD-10-CM | POA: Diagnosis not present

## 2017-07-23 DIAGNOSIS — H35043 Retinal micro-aneurysms, unspecified, bilateral: Secondary | ICD-10-CM | POA: Diagnosis not present

## 2017-07-23 DIAGNOSIS — E1140 Type 2 diabetes mellitus with diabetic neuropathy, unspecified: Secondary | ICD-10-CM | POA: Diagnosis not present

## 2017-07-23 DIAGNOSIS — E669 Obesity, unspecified: Secondary | ICD-10-CM | POA: Diagnosis not present

## 2017-07-23 DIAGNOSIS — Z79899 Other long term (current) drug therapy: Secondary | ICD-10-CM | POA: Diagnosis not present

## 2017-07-23 DIAGNOSIS — E559 Vitamin D deficiency, unspecified: Secondary | ICD-10-CM | POA: Diagnosis not present

## 2017-07-23 DIAGNOSIS — E782 Mixed hyperlipidemia: Secondary | ICD-10-CM | POA: Diagnosis not present

## 2017-07-23 DIAGNOSIS — I10 Essential (primary) hypertension: Secondary | ICD-10-CM | POA: Diagnosis not present

## 2017-07-23 DIAGNOSIS — D81819 Biotin-dependent carboxylase deficiency, unspecified: Secondary | ICD-10-CM | POA: Diagnosis not present

## 2017-07-23 DIAGNOSIS — E11319 Type 2 diabetes mellitus with unspecified diabetic retinopathy without macular edema: Secondary | ICD-10-CM | POA: Diagnosis not present

## 2017-07-23 DIAGNOSIS — H35059 Retinal neovascularization, unspecified, unspecified eye: Secondary | ICD-10-CM | POA: Diagnosis not present

## 2017-07-23 DIAGNOSIS — E113299 Type 2 diabetes mellitus with mild nonproliferative diabetic retinopathy without macular edema, unspecified eye: Secondary | ICD-10-CM | POA: Diagnosis not present

## 2017-07-24 DIAGNOSIS — Z961 Presence of intraocular lens: Secondary | ICD-10-CM | POA: Diagnosis not present

## 2017-07-24 DIAGNOSIS — H52203 Unspecified astigmatism, bilateral: Secondary | ICD-10-CM | POA: Diagnosis not present

## 2017-07-24 DIAGNOSIS — E113293 Type 2 diabetes mellitus with mild nonproliferative diabetic retinopathy without macular edema, bilateral: Secondary | ICD-10-CM | POA: Diagnosis not present

## 2017-10-08 DIAGNOSIS — M5136 Other intervertebral disc degeneration, lumbar region: Secondary | ICD-10-CM | POA: Diagnosis not present

## 2017-10-08 DIAGNOSIS — M25551 Pain in right hip: Secondary | ICD-10-CM | POA: Diagnosis not present

## 2017-10-08 DIAGNOSIS — M545 Low back pain: Secondary | ICD-10-CM | POA: Diagnosis not present

## 2017-12-04 DIAGNOSIS — Z23 Encounter for immunization: Secondary | ICD-10-CM | POA: Diagnosis not present

## 2018-02-18 DIAGNOSIS — Z23 Encounter for immunization: Secondary | ICD-10-CM | POA: Diagnosis not present

## 2018-02-18 DIAGNOSIS — E559 Vitamin D deficiency, unspecified: Secondary | ICD-10-CM | POA: Diagnosis not present

## 2018-02-18 DIAGNOSIS — Z79899 Other long term (current) drug therapy: Secondary | ICD-10-CM | POA: Diagnosis not present

## 2018-02-18 DIAGNOSIS — I10 Essential (primary) hypertension: Secondary | ICD-10-CM | POA: Diagnosis not present

## 2018-02-18 DIAGNOSIS — B354 Tinea corporis: Secondary | ICD-10-CM | POA: Diagnosis not present

## 2018-02-18 DIAGNOSIS — E1140 Type 2 diabetes mellitus with diabetic neuropathy, unspecified: Secondary | ICD-10-CM | POA: Diagnosis not present

## 2018-02-18 DIAGNOSIS — R000 Tachycardia, unspecified: Secondary | ICD-10-CM | POA: Diagnosis not present

## 2018-02-18 DIAGNOSIS — E11319 Type 2 diabetes mellitus with unspecified diabetic retinopathy without macular edema: Secondary | ICD-10-CM | POA: Diagnosis not present

## 2018-02-18 DIAGNOSIS — E039 Hypothyroidism, unspecified: Secondary | ICD-10-CM | POA: Diagnosis not present

## 2018-02-18 DIAGNOSIS — E1165 Type 2 diabetes mellitus with hyperglycemia: Secondary | ICD-10-CM | POA: Diagnosis not present

## 2018-02-18 DIAGNOSIS — E782 Mixed hyperlipidemia: Secondary | ICD-10-CM | POA: Diagnosis not present

## 2018-02-18 DIAGNOSIS — Z0001 Encounter for general adult medical examination with abnormal findings: Secondary | ICD-10-CM | POA: Diagnosis not present

## 2018-03-25 DIAGNOSIS — I10 Essential (primary) hypertension: Secondary | ICD-10-CM | POA: Diagnosis not present

## 2018-03-25 DIAGNOSIS — L299 Pruritus, unspecified: Secondary | ICD-10-CM | POA: Diagnosis not present

## 2018-04-03 DIAGNOSIS — B356 Tinea cruris: Secondary | ICD-10-CM | POA: Diagnosis not present

## 2018-04-25 DIAGNOSIS — E113493 Type 2 diabetes mellitus with severe nonproliferative diabetic retinopathy without macular edema, bilateral: Secondary | ICD-10-CM | POA: Diagnosis not present

## 2018-04-25 DIAGNOSIS — H359 Unspecified retinal disorder: Secondary | ICD-10-CM | POA: Diagnosis not present

## 2018-04-25 DIAGNOSIS — H35043 Retinal micro-aneurysms, unspecified, bilateral: Secondary | ICD-10-CM | POA: Diagnosis not present

## 2018-04-25 DIAGNOSIS — H3563 Retinal hemorrhage, bilateral: Secondary | ICD-10-CM | POA: Diagnosis not present

## 2018-06-20 DIAGNOSIS — Z7984 Long term (current) use of oral hypoglycemic drugs: Secondary | ICD-10-CM | POA: Diagnosis not present

## 2018-06-20 DIAGNOSIS — E559 Vitamin D deficiency, unspecified: Secondary | ICD-10-CM | POA: Diagnosis not present

## 2018-06-20 DIAGNOSIS — E11319 Type 2 diabetes mellitus with unspecified diabetic retinopathy without macular edema: Secondary | ICD-10-CM | POA: Diagnosis not present

## 2018-06-20 DIAGNOSIS — E039 Hypothyroidism, unspecified: Secondary | ICD-10-CM | POA: Diagnosis not present

## 2018-06-20 DIAGNOSIS — I10 Essential (primary) hypertension: Secondary | ICD-10-CM | POA: Diagnosis not present

## 2018-06-20 DIAGNOSIS — E782 Mixed hyperlipidemia: Secondary | ICD-10-CM | POA: Diagnosis not present

## 2018-10-01 DIAGNOSIS — H52203 Unspecified astigmatism, bilateral: Secondary | ICD-10-CM | POA: Diagnosis not present

## 2018-10-01 DIAGNOSIS — Z961 Presence of intraocular lens: Secondary | ICD-10-CM | POA: Diagnosis not present

## 2018-10-01 DIAGNOSIS — E113293 Type 2 diabetes mellitus with mild nonproliferative diabetic retinopathy without macular edema, bilateral: Secondary | ICD-10-CM | POA: Diagnosis not present

## 2018-12-11 DIAGNOSIS — B86 Scabies: Secondary | ICD-10-CM | POA: Diagnosis not present

## 2018-12-12 DIAGNOSIS — Z23 Encounter for immunization: Secondary | ICD-10-CM | POA: Diagnosis not present

## 2019-01-30 DIAGNOSIS — H35043 Retinal micro-aneurysms, unspecified, bilateral: Secondary | ICD-10-CM | POA: Diagnosis not present

## 2019-01-30 DIAGNOSIS — E113493 Type 2 diabetes mellitus with severe nonproliferative diabetic retinopathy without macular edema, bilateral: Secondary | ICD-10-CM | POA: Diagnosis not present

## 2019-01-30 DIAGNOSIS — H3563 Retinal hemorrhage, bilateral: Secondary | ICD-10-CM | POA: Diagnosis not present

## 2019-10-30 ENCOUNTER — Ambulatory Visit (INDEPENDENT_AMBULATORY_CARE_PROVIDER_SITE_OTHER): Payer: Medicare PPO | Admitting: Ophthalmology

## 2019-10-30 ENCOUNTER — Other Ambulatory Visit: Payer: Self-pay

## 2019-10-30 ENCOUNTER — Encounter (INDEPENDENT_AMBULATORY_CARE_PROVIDER_SITE_OTHER): Payer: Self-pay | Admitting: Ophthalmology

## 2019-10-30 DIAGNOSIS — E113392 Type 2 diabetes mellitus with moderate nonproliferative diabetic retinopathy without macular edema, left eye: Secondary | ICD-10-CM | POA: Insufficient documentation

## 2019-10-30 DIAGNOSIS — H43812 Vitreous degeneration, left eye: Secondary | ICD-10-CM | POA: Diagnosis not present

## 2019-10-30 DIAGNOSIS — E113393 Type 2 diabetes mellitus with moderate nonproliferative diabetic retinopathy without macular edema, bilateral: Secondary | ICD-10-CM | POA: Insufficient documentation

## 2019-10-30 NOTE — Assessment & Plan Note (Signed)
The nature of moderate nonproliferative diabetic retinopathy was discussed with the patient as well as the need for more frequent follow up to judge for progression. Good blood glucose, blood pressure, and serum lipid control was recommended as well as avoidance of smoking and maintenance of normal weight.  Close follow up with PCP encouraged. °

## 2019-10-30 NOTE — Assessment & Plan Note (Signed)

## 2019-10-30 NOTE — Progress Notes (Signed)
10/30/2019     CHIEF COMPLAINT Patient presents for Retina Follow Up   HISTORY OF PRESENT ILLNESS: Michael Cowan. is a 79 y.o. male who presents to the clinic today for:   HPI    Retina Follow Up    Patient presents with  Diabetic Retinopathy.  In both eyes.  This started 9 months ago.  Severity is mild.  Duration of 9 months.  Since onset it is stable.          Comments    9 Month Diabetic F/U OU  Pt denies noticeable changes to [place] OU since last visit. Pt denies ocular pain, flashes of light, or floaters OU.  A1c: pt does not recall LBS: pt has not checked recently       Last edited by Rockie Neighbours, [place] on 10/30/2019 10:06 AM. (History)      Referring physician: Hurman Horn, MD [place],  [place] [id number]  HISTORICAL INFORMATION:   Selected notes from the [facility]: No current outpatient medications on file. (Ophthalmic Drugs)   No current facility-administered medications for this visit. (Ophthalmic Drugs)   No current outpatient medications on file. (Other)   No current facility-administered medications for this visit. (Other)      REVIEW OF SYSTEMS:    ALLERGIES No Known Allergies  PAST MEDICAL HISTORY Past Medical History:  Diagnosis Date  . Diabetes mellitus    Past Surgical History:  Procedure Laterality Date  . CATARACT EXTRACTION, BILATERAL      FAMILY HISTORY History reviewed. No pertinent family history.  SOCIAL HISTORY Social History   Tobacco Use  . Smoking status: Never Smoker  . Smokeless tobacco: Never Used  Substance Use Topics  . Alcohol use: Not on file  . Drug use: Not on file         OPHTHALMIC EXAM:  Base Eye Exam    Visual Acuity (ETDRS)      Right Left   Dist cc 20/30 +1 20/20   Dist ph cc NI    Correction: Glasses       Tonometry (Tonopen, 10:09 AM)      Right Left   Pressure 15 14       Pupils      Pupils Dark Light Shape React APD     Right PERRL 4 3 Round Brisk None   Left PERRL 4 3 Round Brisk None       Visual Fields (Counting fingers)      Left Right   Restrictions Partial outer inferior temporal deficiency Partial outer inferior nasal deficiency       Extraocular Movement      Right Left    Full Full       Neuro/Psych    Oriented x3: Yes   Mood/Affect: Normal       Dilation    Both eyes: 1.0% Mydriacyl, 2.5% Phenylephrine @ 10:11 AM        Slit Lamp and Fundus Exam    External Exam      Right Left   External Normal Normal       Slit Lamp Exam      Right Left   Lids/Lashes Normal Normal   Conjunctiva/Sclera White and quiet White and quiet   Cornea Clear Clear   Anterior Chamber Deep and quiet Deep and quiet   Iris Round and reactive Round and reactive   Lens Posterior chamber intraocular  lens, clear fluid in capsule Posterior chamber intraocular lens   Anterior Vitreous Normal Normal       Fundus Exam      Right Left   Posterior Vitreous Normal Posterior vitreous detachment   Disc Normal Normal   C/D Ratio 0.3 0.45   Macula Microaneurysms, no macular thickening, no clinically significant macular edema Microaneurysms, no macular thickening, no clinically significant macular edema   Vessels NPDR- Moderate NPDR- Moderate   Periphery Normal Normal          IMAGING AND PROCEDURES  Imaging and Procedures for 10/30/19  OCT, Retina - OU - Both Eyes       Right Eye Quality was good. Scan locations included subfoveal. Central Foveal Thickness: 266. Progression has been stable.   Left Eye Quality was good. Scan locations included subfoveal. Central Foveal Thickness: 290. Progression has been stable.   Notes No active CSME currently, will continue to observe                ASSESSMENT/PLAN:  Moderate nonproliferative diabetic retinopathy of both eyes without macular edema associated with type 2 diabetes mellitus ([place]) The nature of moderate nonproliferative diabetic  retinopathy was discussed with the patient as well as the need for more frequent follow up to judge for progression. Good blood glucose, blood pressure, and serum lipid control was recommended as well as avoidance of smoking and maintenance of normal weight.  Close follow up with PCP encouraged.  Posterior vitreous detachment of left eye   The nature of posterior vitreous detachment was discussed with the patient as well as its physiology, its age prevalence, and its possible implication regarding retinal breaks and detachment.  An informational brochure was given to the patient.  All the patient's questions were answered.  The patient was asked to return if new or different flashes or floaters develops.   Patient was instructed to contact office immediately if any changes were noticed. I explained to the patient that vitreous inside the eye is similar to jello inside a bowl. As the jello melts it can start to pull away from the bowl, similarly the vitreous throughout our lives can begin to pull away from the retina. That process is called a posterior vitreous detachment. In some cases, the vitreous can tug hard enough on the retina to form a retinal tear. I discussed with the patient the signs and symptoms of a retinal detachment.  Do not rub the eye.      ICD-10-CM   1. 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 Posterior vitreous detachment of left eye  H43.812     1.  2.  3.  Ophthalmic Meds Ordered this visit:  No orders of the defined types were placed in this encounter.      Return in about 9 months (around 07/29/2020) for DILATE OU, COLOR FP, OCT.  There are no Patient Instructions on file for this visit.   Explained the diagnoses, plan, and follow up with the patient and they expressed understanding.  Patient expressed understanding of the importance of  proper follow up care.   Clent Demark Lanea Vankirk M.D. Diseases & Surgery of the Retina and Vitreous Retina & Diabetic [place] 10/30/19     Abbreviations: M myopia (nearsighted); A astigmatism; H hyperopia (farsighted); P presbyopia; Mrx spectacle prescription;  CTL contact lenses; OD right eye; OS left eye; OU  both eyes  XT exotropia; ET esotropia; PEK punctate epithelial keratitis; PEE punctate epithelial erosions; DES dry eye syndrome; MGD meibomian gland dysfunction; ATs artificial tears; PFAT's preservative free artificial tears; [place] nuclear sclerotic cataract; PSC posterior subcapsular cataract; ERM epi-retinal membrane; PVD posterior vitreous detachment; RD retinal detachment; DM diabetes mellitus; DR diabetic retinopathy; NPDR non-proliferative diabetic retinopathy; PDR proliferative diabetic retinopathy; CSME clinically significant macular edema; DME diabetic macular edema; dbh dot blot hemorrhages; CWS cotton wool spot; POAG primary open angle glaucoma; C/D cup-to-disc ratio; HVF humphrey visual field; GVF goldmann visual field; OCT optical coherence tomography; IOP intraocular pressure; BRVO Branch retinal vein occlusion; CRVO central retinal vein occlusion; CRAO central retinal artery occlusion; BRAO branch retinal artery occlusion; RT retinal tear; SB scleral buckle; PPV pars plana vitrectomy; VH Vitreous hemorrhage; PRP panretinal laser photocoagulation; IVK intravitreal kenalog; VMT vitreomacular traction; MH Macular hole;  NVD neovascularization of the disc; NVE neovascularization elsewhere; AREDS age related eye disease study; ARMD age related macular degeneration; POAG primary open angle glaucoma; EBMD epithelial/anterior basement membrane dystrophy; ACIOL anterior chamber intraocular lens; IOL intraocular lens; PCIOL posterior chamber intraocular lens; Phaco/IOL phacoemulsification with intraocular lens placement; [place] photorefractive keratectomy; LASIK laser assisted in situ keratomileusis;  HTN hypertension; DM diabetes mellitus; COPD chronic obstructive pulmonary disease

## 2020-05-18 DIAGNOSIS — S335XXA Sprain of ligaments of lumbar spine, initial encounter: Secondary | ICD-10-CM | POA: Diagnosis not present

## 2020-05-18 DIAGNOSIS — M9903 Segmental and somatic dysfunction of lumbar region: Secondary | ICD-10-CM | POA: Diagnosis not present

## 2020-05-18 DIAGNOSIS — M9904 Segmental and somatic dysfunction of sacral region: Secondary | ICD-10-CM | POA: Diagnosis not present

## 2020-05-18 DIAGNOSIS — M9902 Segmental and somatic dysfunction of thoracic region: Secondary | ICD-10-CM | POA: Diagnosis not present

## 2020-05-20 DIAGNOSIS — S335XXA Sprain of ligaments of lumbar spine, initial encounter: Secondary | ICD-10-CM | POA: Diagnosis not present

## 2020-05-20 DIAGNOSIS — M9903 Segmental and somatic dysfunction of lumbar region: Secondary | ICD-10-CM | POA: Diagnosis not present

## 2020-05-20 DIAGNOSIS — M9902 Segmental and somatic dysfunction of thoracic region: Secondary | ICD-10-CM | POA: Diagnosis not present

## 2020-05-20 DIAGNOSIS — M9904 Segmental and somatic dysfunction of sacral region: Secondary | ICD-10-CM | POA: Diagnosis not present

## 2020-05-24 DIAGNOSIS — M9903 Segmental and somatic dysfunction of lumbar region: Secondary | ICD-10-CM | POA: Diagnosis not present

## 2020-05-24 DIAGNOSIS — S335XXA Sprain of ligaments of lumbar spine, initial encounter: Secondary | ICD-10-CM | POA: Diagnosis not present

## 2020-05-24 DIAGNOSIS — M9904 Segmental and somatic dysfunction of sacral region: Secondary | ICD-10-CM | POA: Diagnosis not present

## 2020-05-24 DIAGNOSIS — M9902 Segmental and somatic dysfunction of thoracic region: Secondary | ICD-10-CM | POA: Diagnosis not present

## 2020-05-31 DIAGNOSIS — M9903 Segmental and somatic dysfunction of lumbar region: Secondary | ICD-10-CM | POA: Diagnosis not present

## 2020-05-31 DIAGNOSIS — M9902 Segmental and somatic dysfunction of thoracic region: Secondary | ICD-10-CM | POA: Diagnosis not present

## 2020-05-31 DIAGNOSIS — M9904 Segmental and somatic dysfunction of sacral region: Secondary | ICD-10-CM | POA: Diagnosis not present

## 2020-05-31 DIAGNOSIS — S335XXA Sprain of ligaments of lumbar spine, initial encounter: Secondary | ICD-10-CM | POA: Diagnosis not present

## 2020-06-14 DIAGNOSIS — E1165 Type 2 diabetes mellitus with hyperglycemia: Secondary | ICD-10-CM | POA: Diagnosis not present

## 2020-06-14 DIAGNOSIS — I10 Essential (primary) hypertension: Secondary | ICD-10-CM | POA: Diagnosis not present

## 2020-06-14 DIAGNOSIS — E673 Hypervitaminosis D: Secondary | ICD-10-CM | POA: Diagnosis not present

## 2020-06-14 DIAGNOSIS — E1140 Type 2 diabetes mellitus with diabetic neuropathy, unspecified: Secondary | ICD-10-CM | POA: Diagnosis not present

## 2020-07-29 ENCOUNTER — Encounter (INDEPENDENT_AMBULATORY_CARE_PROVIDER_SITE_OTHER): Payer: Medicare PPO | Admitting: Ophthalmology

## 2020-08-25 ENCOUNTER — Other Ambulatory Visit: Payer: Self-pay

## 2020-08-25 ENCOUNTER — Ambulatory Visit (INDEPENDENT_AMBULATORY_CARE_PROVIDER_SITE_OTHER): Payer: Medicare PPO | Admitting: Ophthalmology

## 2020-08-25 ENCOUNTER — Encounter (INDEPENDENT_AMBULATORY_CARE_PROVIDER_SITE_OTHER): Payer: Self-pay | Admitting: Ophthalmology

## 2020-08-25 DIAGNOSIS — E113393 Type 2 diabetes mellitus with moderate nonproliferative diabetic retinopathy without macular edema, bilateral: Secondary | ICD-10-CM

## 2020-08-25 DIAGNOSIS — E113392 Type 2 diabetes mellitus with moderate nonproliferative diabetic retinopathy without macular edema, left eye: Secondary | ICD-10-CM | POA: Diagnosis not present

## 2020-08-25 DIAGNOSIS — H43812 Vitreous degeneration, left eye: Secondary | ICD-10-CM

## 2020-08-25 DIAGNOSIS — E113311 Type 2 diabetes mellitus with moderate nonproliferative diabetic retinopathy with macular edema, right eye: Secondary | ICD-10-CM | POA: Diagnosis not present

## 2020-08-25 NOTE — Assessment & Plan Note (Signed)
Stable OS no active progression

## 2020-08-25 NOTE — Assessment & Plan Note (Signed)
Follow-up in 6 months to monitor this nonacuity threatening minor perifoveal CME

## 2020-08-25 NOTE — Progress Notes (Signed)
08/25/2020     CHIEF COMPLAINT Patient presents for Retina Follow Up (9 month fu OU and fp/Pt states, "My vision seems to be about the same but my blood sugars are a little all over the place. I am working with Dr. Inda Merlin on a new medicine to get it down."/A1C: 9.0/LBS: doesn't check)   HISTORY OF PRESENT ILLNESS: Michael Cowan. is a 80 y.o. male who presents to the clinic today for:   HPI     Retina Follow Up           Diagnosis: Diabetic Retinopathy   Laterality: both eyes   Onset: 9 months ago   Severity: mild   Duration: 9 months   Course: stable   Comments: 9 month fu OU and fp Pt states, "My vision seems to be about the same but my blood sugars are a little all over the place. I am working with Dr. Inda Merlin on a new medicine to get it down." A1C: 9.0 LBS: doesn't check       Last edited by [name], COA on 08/25/2020 10:29 AM.      Referring physician: No referring provider defined for this encounter.  HISTORICAL INFORMATION:   Selected notes from the MEDICAL RECORD NUMBER       CURRENT MEDICATIONS: No current outpatient medications on file. (Ophthalmic Drugs)   No current facility-administered medications for this visit. (Ophthalmic Drugs)   No current outpatient medications on file. (Other)   No current facility-administered medications for this visit. (Other)      REVIEW OF SYSTEMS:    ALLERGIES No Known Allergies  PAST MEDICAL HISTORY Past Medical History:  Diagnosis Date   Diabetes mellitus    Past Surgical History:  Procedure Laterality Date   CATARACT EXTRACTION, BILATERAL      FAMILY HISTORY History reviewed. No pertinent family history.  SOCIAL HISTORY Social History   Tobacco Use   Smoking status: Never   Smokeless tobacco: Never         OPHTHALMIC EXAM:  Base Eye Exam     Visual Acuity (ETDRS)       Right Left   Dist cc 20/30 20/25 -2   Dist ph cc NI NI         Tonometry (Tonopen, 10:36 AM)        Right Left   Pressure 18 19         Pupils       Pupils Dark Light Shape React APD   Right PERRL 3 3 Round Minimal None   Left PERRL 3 3 Round Minimal None         Visual Fields       Left Right   Restrictions Partial outer inferior temporal deficiency Partial outer inferior nasal deficiency         Neuro/Psych     Oriented x3: Yes   Mood/Affect: Normal         Dilation     Both eyes: 1.0% Mydriacyl, 2.5% Phenylephrine @ 10:36 AM           Slit Lamp and Fundus Exam     External Exam       Right Left   External Normal Normal         Slit Lamp Exam       Right Left   Lids/Lashes Normal Normal   Conjunctiva/Sclera White and quiet White and quiet   Cornea Clear Clear   Anterior Chamber Deep and  quiet Deep and quiet   Iris Round and reactive Round and reactive   Lens Centered posterior chamber intraocular lens Centered posterior chamber intraocular lens   Anterior Vitreous Normal Normal         Fundus Exam       Right Left   Posterior Vitreous Central vitreous floaters Posterior vitreous detachment   Disc Normal Normal   C/D Ratio 0.3 0.3   Macula Microaneurysms, no macular thickening, no clinically significant macular edema Microaneurysms, no macular thickening, no clinically significant macular edema   Vessels NPDR- Moderate NPDR- Moderate   Periphery Normal Normal            IMAGING AND PROCEDURES  Imaging and Procedures for 08/25/20  Color Fundus Photography Optos - OU - Both Eyes       Right Eye Progression has been stable. Disc findings include normal observations. Macula : microaneurysms. Periphery : normal observations.   Left Eye Progression has been stable. Disc findings include normal observations. Macula : microaneurysms.   Notes Moderate nonproliferative diabetic retinopathy OU.  Central vitreous floaters OD,  OD with mid peripheral chorioretinal scar inferonasal to the nerve, no Active disease no inflammation      OCT, Retina - OU - Both Eyes       Right Eye Quality was good. Scan locations included subfoveal. Central Foveal Thickness: 302. Progression has been stable. Findings include abnormal foveal contour, cystoid macular edema.   Left Eye Quality was good. Scan locations included subfoveal. Central Foveal Thickness: 305. Progression has been stable.   Notes Minor onset of perifoveal CSME temporal aspect of the fovea OD.  Not center involved not impactful on acuity is okay to observe this with you otherwise moderate nonproliferative diabetic retinopathy  OS no active CSME             ASSESSMENT/PLAN:  Moderate nonproliferative diabetic retinopathy of left eye (HCC) Stable OS no active progression  Moderate nonproliferative diabetic retinopathy of right eye with macular edema (HCC) Follow-up in 6 months to monitor this nonacuity threatening minor perifoveal CME      ICD-10-CM   1. 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 Posterior vitreous detachment of left eye  H43.812 Color Fundus Photography Optos - OU - Both Eyes    OCT, Retina - OU - Both Eyes    3. Moderate nonproliferative diabetic retinopathy of left eye without macular edema associated with type 2 diabetes mellitus ([place])  G01.7494     4. Moderate nonproliferative diabetic retinopathy of right eye with macular edema associated with type 2 diabetes mellitus ([place])  E11.3311       1.  Minor CSME detected OD, none acuity threatening we will continue to monitor at 53-month interval.  2.  3.  Ophthalmic Meds Ordered this visit:  No orders of the defined types were placed in this encounter.      Return in about 6 months (around 02/24/2021) for DILATE OU, OCT, COLOR FP.  There are no Patient Instructions on file for this  visit.   Explained the diagnoses, plan, and follow up with the patient and they expressed understanding.  Patient expressed understanding of the importance of proper follow up care.   Clent Demark Caryssa Elzey M.D. Diseases & Surgery of the Retina and Vitreous Retina & Diabetic  [place] 08/25/20     Abbreviations: M myopia (nearsighted); A astigmatism; H hyperopia (farsighted); P presbyopia; Mrx spectacle prescription;  CTL contact lenses; OD right eye; OS left eye; OU both eyes  XT exotropia; ET esotropia; PEK punctate epithelial keratitis; PEE punctate epithelial erosions; DES dry eye syndrome; MGD meibomian gland dysfunction; ATs artificial tears; PFAT's preservative free artificial tears; [place] nuclear sclerotic cataract; PSC posterior subcapsular cataract; ERM epi-retinal membrane; PVD posterior vitreous detachment; RD retinal detachment; DM diabetes mellitus; DR diabetic retinopathy; NPDR non-proliferative diabetic retinopathy; PDR proliferative diabetic retinopathy; CSME clinically significant macular edema; DME diabetic macular edema; dbh dot blot hemorrhages; CWS cotton wool spot; POAG primary open angle glaucoma; C/D cup-to-disc ratio; HVF humphrey visual field; GVF goldmann visual field; OCT optical coherence tomography; IOP intraocular pressure; BRVO Branch retinal vein occlusion; CRVO central retinal vein occlusion; CRAO central retinal artery occlusion; BRAO branch retinal artery occlusion; RT retinal tear; SB scleral buckle; PPV pars plana vitrectomy; VH Vitreous hemorrhage; PRP panretinal laser photocoagulation; IVK intravitreal kenalog; VMT vitreomacular traction; MH Macular hole;  NVD neovascularization of the disc; NVE neovascularization elsewhere; AREDS age related eye disease study; ARMD age related macular degeneration; POAG primary open angle glaucoma; EBMD epithelial/anterior basement membrane dystrophy; ACIOL anterior chamber intraocular lens; IOL intraocular lens; PCIOL posterior chamber  intraocular lens; Phaco/IOL phacoemulsification with intraocular lens placement; [place] photorefractive keratectomy; LASIK laser assisted in situ keratomileusis; HTN hypertension; DM diabetes mellitus; COPD chronic obstructive pulmonary disease

## 2020-09-15 DIAGNOSIS — E1140 Type 2 diabetes mellitus with diabetic neuropathy, unspecified: Secondary | ICD-10-CM | POA: Diagnosis not present

## 2020-09-15 DIAGNOSIS — I10 Essential (primary) hypertension: Secondary | ICD-10-CM | POA: Diagnosis not present

## 2020-09-15 DIAGNOSIS — E673 Hypervitaminosis D: Secondary | ICD-10-CM | POA: Diagnosis not present

## 2020-09-15 DIAGNOSIS — Z7984 Long term (current) use of oral hypoglycemic drugs: Secondary | ICD-10-CM | POA: Diagnosis not present

## 2020-09-15 DIAGNOSIS — E1165 Type 2 diabetes mellitus with hyperglycemia: Secondary | ICD-10-CM | POA: Diagnosis not present

## 2020-11-23 DIAGNOSIS — Z23 Encounter for immunization: Secondary | ICD-10-CM | POA: Diagnosis not present

## 2020-12-14 DIAGNOSIS — H52203 Unspecified astigmatism, bilateral: Secondary | ICD-10-CM | POA: Diagnosis not present

## 2020-12-14 DIAGNOSIS — E113293 Type 2 diabetes mellitus with mild nonproliferative diabetic retinopathy without macular edema, bilateral: Secondary | ICD-10-CM | POA: Diagnosis not present

## 2020-12-14 DIAGNOSIS — Z961 Presence of intraocular lens: Secondary | ICD-10-CM | POA: Diagnosis not present

## 2021-03-02 ENCOUNTER — Encounter (INDEPENDENT_AMBULATORY_CARE_PROVIDER_SITE_OTHER): Payer: Self-pay | Admitting: Ophthalmology

## 2021-03-02 ENCOUNTER — Other Ambulatory Visit: Payer: Self-pay

## 2021-03-02 ENCOUNTER — Ambulatory Visit (INDEPENDENT_AMBULATORY_CARE_PROVIDER_SITE_OTHER): Payer: Medicare PPO | Admitting: Ophthalmology

## 2021-03-02 DIAGNOSIS — E113311 Type 2 diabetes mellitus with moderate nonproliferative diabetic retinopathy with macular edema, right eye: Secondary | ICD-10-CM

## 2021-03-02 DIAGNOSIS — E113392 Type 2 diabetes mellitus with moderate nonproliferative diabetic retinopathy without macular edema, left eye: Secondary | ICD-10-CM

## 2021-03-02 DIAGNOSIS — H43812 Vitreous degeneration, left eye: Secondary | ICD-10-CM

## 2021-03-02 DIAGNOSIS — E113393 Type 2 diabetes mellitus with moderate nonproliferative diabetic retinopathy without macular edema, bilateral: Secondary | ICD-10-CM | POA: Diagnosis not present

## 2021-03-02 NOTE — Assessment & Plan Note (Signed)
OS stable 

## 2021-03-02 NOTE — Assessment & Plan Note (Signed)
OD with OCT evidence of micro CME temporally, no impact on acuity and stable over the last 7-month interval.  Therefore okay to observe and extend interval examination back to 9 months

## 2021-03-02 NOTE — Progress Notes (Signed)
03/02/2021     CHIEF COMPLAINT Patient presents for  Chief Complaint  Patient presents with   Retina Follow Up      HISTORY OF PRESENT ILLNESS: Michael Cowan. is a 80 y.o. male who presents to the clinic today for:   HPI     Retina Follow Up           Diagnosis: Other   Laterality: both eyes   Onset: 6 months ago   Severity: mild   Duration: 6 months   Course: stable         Comments   6 month fu ou and OCT/FP- Moderate NPDR of both eyes- Type II DM  Pt states VA OU stable since last visit. Pt denies FOL, floaters, or ocular pain OU.  Pt states, "I see no change in my vision and I saw Dr. [name] in September and she said everything was the same too."      Last edited by [name], COA on 03/02/2021 10:56 AM.      Referring physician: [name], MD [place],  [place] [address]  HISTORICAL INFORMATION:   Selected notes from the [facility]: No current outpatient medications on file. (Ophthalmic Drugs)   No current facility-administered medications for this visit. (Ophthalmic Drugs)   No current outpatient medications on file. (Other)   No current facility-administered medications for this visit. (Other)      REVIEW OF SYSTEMS:    ALLERGIES No Known Allergies  PAST MEDICAL HISTORY Past Medical History:  Diagnosis Date   Diabetes mellitus    Past Surgical History:  Procedure Laterality Date   CATARACT EXTRACTION, BILATERAL      FAMILY HISTORY History reviewed. No pertinent family history.  SOCIAL HISTORY Social History   Tobacco Use   Smoking status: Never   Smokeless tobacco: Never         OPHTHALMIC EXAM:  Base Eye Exam     Visual Acuity (ETDRS)       Right Left   Dist cc 20/50 -1 20/20   Dist ph cc 20/40 -1     Correction: Glasses         Tonometry (Tonopen, 11:00 AM)       Right Left   Pressure 19 17         Pupils       Pupils  Dark Light Shape React APD   Right PERRL 3 3 Round Minimal None   Left PERRL 3 3 Round Minimal None         Visual Fields (Counting fingers)       Left Right   Restrictions Partial outer inferior temporal, inferior nasal deficiencies Partial outer inferior nasal deficiency         Extraocular Movement       Right Left    Full, Ortho Full, Ortho         Neuro/Psych     Oriented x3: Yes   Mood/Affect: Normal         Dilation     Both eyes: 1.0% Mydriacyl, 2.5% Phenylephrine @ 11:00 AM           Slit Lamp and Fundus Exam     External Exam       Right Left   External Normal Normal         Slit Lamp Exam       Right Left  Lids/Lashes Normal Normal   Conjunctiva/Sclera White and quiet White and quiet   Cornea Clear Clear   Anterior Chamber Deep and quiet Deep and quiet   Iris Round and reactive Round and reactive   Lens Centered posterior chamber intraocular lens Centered posterior chamber intraocular lens   Anterior Vitreous Normal Normal         Fundus Exam       Right Left   Posterior Vitreous Central vitreous floaters Posterior vitreous detachment   Disc Normal Normal   C/D Ratio 0.3 0.3   Macula Microaneurysms, no macular thickening, no clinically significant macular edema Microaneurysms, no macular thickening, no clinically significant macular edema   Vessels NPDR- Moderate NPDR- Moderate   Periphery Normal Normal            IMAGING AND PROCEDURES  Imaging and Procedures for 03/02/21  OCT, Retina - OU - Both Eyes       Right Eye Quality was good. Scan locations included subfoveal. Central Foveal Thickness: 293. Progression has been stable. Findings include abnormal foveal contour, cystoid macular edema.   Left Eye Quality was good. Scan locations included subfoveal. Central Foveal Thickness: 304. Progression has been stable.   Notes Minor onset of perifoveal CSME temporal aspect of the fovea OD.  Not center involved not  impactful on acuity is okay to observe this with you otherwise moderate nonproliferative diabetic retinopathy  OS no active CSME     Color Fundus Photography Optos - OU - Both Eyes       Right Eye Progression has been stable. Disc findings include normal observations. Macula : microaneurysms. Periphery : normal observations.   Left Eye Progression has been stable. Disc findings include normal observations. Macula : microaneurysms.   Notes Moderate nonproliferative diabetic retinopathy OU.  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  Therefore okay to observe and extend interval examination back to 9 months  Moderate nonproliferative diabetic retinopathy of left eye (HCC) OS stable     ICD-10-CM   1. Moderate nonproliferative diabetic retinopathy of both eyes without macular edema associated with type 2 diabetes mellitus (HCC)  V89.3810 OCT, Retina - OU - Both Eyes    Color Fundus Photography Optos - OU - Both Eyes    2. Posterior vitreous detachment of left eye  H43.812     3. 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 Moderate nonproliferative diabetic retinopathy of left eye without macular edema associated with type 2 diabetes mellitus ([place])  F75.1025       1.  OU with moderate NPDR, stable  2.  Minor CSME OD found 6 months previous on OCT with no impact on acuity remained stable thus continue to observe no specific therapy indicated  3.  Ophthalmic Meds Ordered this visit:  No orders of the defined types were placed in this encounter.      Return  in about 9 months (around 12/01/2021) for DILATE OU, OCT.  There are no Patient Instructions on file for this visit.   Explained the diagnoses, plan, and follow up with the  patient and they expressed understanding.  Patient expressed understanding of the importance of proper follow up care.   Michael Cowan M.D. Diseases & Surgery of the Retina and Vitreous [facility] [place] 03/02/21     Abbreviations: M myopia (nearsighted); A astigmatism; H hyperopia (farsighted); P presbyopia; Mrx spectacle prescription;  CTL contact lenses; OD right eye; OS left eye; OU both eyes  XT exotropia; ET esotropia; PEK punctate epithelial keratitis; PEE punctate epithelial erosions; DES dry eye syndrome; MGD meibomian gland dysfunction; ATs artificial tears; PFAT's preservative free artificial tears; [place] nuclear sclerotic cataract; PSC posterior subcapsular cataract; ERM epi-retinal membrane; PVD posterior vitreous detachment; RD retinal detachment; DM diabetes mellitus; DR diabetic retinopathy; NPDR non-proliferative diabetic retinopathy; PDR proliferative diabetic retinopathy; CSME clinically significant macular edema; DME diabetic macular edema; dbh dot blot hemorrhages; CWS cotton wool spot; POAG primary open angle glaucoma; C/D cup-to-disc ratio; HVF humphrey visual field; GVF goldmann visual field; OCT optical coherence tomography; IOP intraocular pressure; BRVO Branch retinal vein occlusion; CRVO central retinal vein occlusion; CRAO central retinal artery occlusion; BRAO branch retinal artery occlusion; RT retinal tear; SB scleral buckle; PPV pars plana vitrectomy; VH Vitreous hemorrhage; PRP panretinal laser photocoagulation; IVK intravitreal kenalog; VMT vitreomacular traction; MH Macular hole;  NVD neovascularization of the disc; NVE neovascularization elsewhere; AREDS age related eye disease study; ARMD age related macular degeneration; POAG primary open angle glaucoma; EBMD epithelial/anterior  basement membrane dystrophy; ACIOL anterior chamber intraocular lens; IOL intraocular lens; PCIOL posterior chamber intraocular lens; Phaco/IOL phacoemulsification with intraocular lens placement; [place] photorefractive keratectomy; LASIK laser assisted in situ keratomileusis; HTN hypertension; DM diabetes mellitus; COPD chronic obstructive pulmonary disease

## 2021-05-25 DIAGNOSIS — E669 Obesity, unspecified: Secondary | ICD-10-CM | POA: Diagnosis not present

## 2021-05-25 DIAGNOSIS — I10 Essential (primary) hypertension: Secondary | ICD-10-CM | POA: Diagnosis not present

## 2021-05-25 DIAGNOSIS — E1165 Type 2 diabetes mellitus with hyperglycemia: Secondary | ICD-10-CM | POA: Diagnosis not present

## 2021-05-25 DIAGNOSIS — E673 Hypervitaminosis D: Secondary | ICD-10-CM | POA: Diagnosis not present

## 2021-05-25 DIAGNOSIS — E1140 Type 2 diabetes mellitus with diabetic neuropathy, unspecified: Secondary | ICD-10-CM | POA: Diagnosis not present

## 2021-05-25 DIAGNOSIS — Z0001 Encounter for general adult medical examination with abnormal findings: Secondary | ICD-10-CM | POA: Diagnosis not present

## 2021-05-25 DIAGNOSIS — E11319 Type 2 diabetes mellitus with unspecified diabetic retinopathy without macular edema: Secondary | ICD-10-CM | POA: Diagnosis not present

## 2021-05-25 DIAGNOSIS — Z79899 Other long term (current) drug therapy: Secondary | ICD-10-CM | POA: Diagnosis not present

## 2021-05-25 DIAGNOSIS — E039 Hypothyroidism, unspecified: Secondary | ICD-10-CM | POA: Diagnosis not present

## 2021-08-15 DIAGNOSIS — R195 Other fecal abnormalities: Secondary | ICD-10-CM | POA: Diagnosis not present

## 2021-08-15 DIAGNOSIS — Z800 Family history of malignant neoplasm of digestive organs: Secondary | ICD-10-CM | POA: Diagnosis not present

## 2021-08-15 DIAGNOSIS — Z86010 Personal history of colonic polyps: Secondary | ICD-10-CM | POA: Diagnosis not present

## 2021-11-08 DIAGNOSIS — Z86010 Personal history of colonic polyps: Secondary | ICD-10-CM | POA: Diagnosis not present

## 2021-11-08 DIAGNOSIS — D122 Benign neoplasm of ascending colon: Secondary | ICD-10-CM | POA: Diagnosis not present

## 2021-11-08 DIAGNOSIS — Z8371 Family history of colonic polyps: Secondary | ICD-10-CM | POA: Diagnosis not present

## 2021-11-08 DIAGNOSIS — K648 Other hemorrhoids: Secondary | ICD-10-CM | POA: Diagnosis not present

## 2021-11-08 DIAGNOSIS — Z980 Intestinal bypass and anastomosis status: Secondary | ICD-10-CM | POA: Diagnosis not present

## 2021-11-08 DIAGNOSIS — Z800 Family history of malignant neoplasm of digestive organs: Secondary | ICD-10-CM | POA: Diagnosis not present

## 2021-11-08 DIAGNOSIS — D123 Benign neoplasm of transverse colon: Secondary | ICD-10-CM | POA: Diagnosis not present

## 2021-11-08 DIAGNOSIS — R195 Other fecal abnormalities: Secondary | ICD-10-CM | POA: Diagnosis not present

## 2021-11-09 DIAGNOSIS — R6521 Severe sepsis with septic shock: Secondary | ICD-10-CM | POA: Diagnosis not present

## 2021-11-09 DIAGNOSIS — R400 Somnolence: Secondary | ICD-10-CM | POA: Diagnosis not present

## 2021-11-09 DIAGNOSIS — E860 Dehydration: Secondary | ICD-10-CM | POA: Diagnosis not present

## 2021-11-09 DIAGNOSIS — E8770 Fluid overload, unspecified: Secondary | ICD-10-CM | POA: Diagnosis not present

## 2021-11-09 DIAGNOSIS — K567 Ileus, unspecified: Secondary | ICD-10-CM | POA: Diagnosis not present

## 2021-11-09 DIAGNOSIS — K529 Noninfective gastroenteritis and colitis, unspecified: Secondary | ICD-10-CM | POA: Diagnosis not present

## 2021-11-09 DIAGNOSIS — E872 Acidosis, unspecified: Secondary | ICD-10-CM | POA: Diagnosis not present

## 2021-11-09 DIAGNOSIS — K521 Toxic gastroenteritis and colitis: Secondary | ICD-10-CM | POA: Diagnosis not present

## 2021-11-09 DIAGNOSIS — A419 Sepsis, unspecified organism: Secondary | ICD-10-CM | POA: Diagnosis not present

## 2021-11-09 DIAGNOSIS — J9690 Respiratory failure, unspecified, unspecified whether with hypoxia or hypercapnia: Secondary | ICD-10-CM | POA: Diagnosis not present

## 2021-11-09 DIAGNOSIS — E876 Hypokalemia: Secondary | ICD-10-CM | POA: Diagnosis not present

## 2021-11-09 DIAGNOSIS — E871 Hypo-osmolality and hyponatremia: Secondary | ICD-10-CM | POA: Diagnosis not present

## 2021-11-09 DIAGNOSIS — Z9049 Acquired absence of other specified parts of digestive tract: Secondary | ICD-10-CM | POA: Diagnosis not present

## 2021-11-09 DIAGNOSIS — J189 Pneumonia, unspecified organism: Secondary | ICD-10-CM | POA: Diagnosis not present

## 2021-11-09 DIAGNOSIS — J811 Chronic pulmonary edema: Secondary | ICD-10-CM | POA: Diagnosis not present

## 2021-11-09 DIAGNOSIS — Z7984 Long term (current) use of oral hypoglycemic drugs: Secondary | ICD-10-CM | POA: Diagnosis not present

## 2021-11-11 DIAGNOSIS — K529 Noninfective gastroenteritis and colitis, unspecified: Secondary | ICD-10-CM | POA: Diagnosis not present

## 2021-11-11 DIAGNOSIS — J811 Chronic pulmonary edema: Secondary | ICD-10-CM | POA: Diagnosis not present

## 2021-11-11 DIAGNOSIS — J9690 Respiratory failure, unspecified, unspecified whether with hypoxia or hypercapnia: Secondary | ICD-10-CM | POA: Diagnosis not present

## 2021-11-28 DIAGNOSIS — R6521 Severe sepsis with septic shock: Secondary | ICD-10-CM | POA: Diagnosis not present

## 2021-11-28 DIAGNOSIS — K529 Noninfective gastroenteritis and colitis, unspecified: Secondary | ICD-10-CM | POA: Diagnosis not present

## 2021-11-28 DIAGNOSIS — R2689 Other abnormalities of gait and mobility: Secondary | ICD-10-CM | POA: Diagnosis not present

## 2021-11-28 DIAGNOSIS — E1169 Type 2 diabetes mellitus with other specified complication: Secondary | ICD-10-CM | POA: Diagnosis not present

## 2021-11-28 DIAGNOSIS — E1165 Type 2 diabetes mellitus with hyperglycemia: Secondary | ICD-10-CM | POA: Diagnosis not present

## 2021-11-28 DIAGNOSIS — E871 Hypo-osmolality and hyponatremia: Secondary | ICD-10-CM | POA: Diagnosis not present

## 2021-11-28 DIAGNOSIS — E876 Hypokalemia: Secondary | ICD-10-CM | POA: Diagnosis not present

## 2021-11-28 DIAGNOSIS — E785 Hyperlipidemia, unspecified: Secondary | ICD-10-CM | POA: Diagnosis not present

## 2021-11-28 DIAGNOSIS — I10 Essential (primary) hypertension: Secondary | ICD-10-CM | POA: Diagnosis not present

## 2021-11-28 DIAGNOSIS — R531 Weakness: Secondary | ICD-10-CM | POA: Diagnosis not present

## 2021-11-28 DIAGNOSIS — I420 Dilated cardiomyopathy: Secondary | ICD-10-CM | POA: Diagnosis not present

## 2021-11-28 DIAGNOSIS — G9341 Metabolic encephalopathy: Secondary | ICD-10-CM | POA: Diagnosis not present

## 2021-11-28 DIAGNOSIS — E039 Hypothyroidism, unspecified: Secondary | ICD-10-CM | POA: Diagnosis not present

## 2021-11-28 DIAGNOSIS — I214 Non-ST elevation (NSTEMI) myocardial infarction: Secondary | ICD-10-CM | POA: Diagnosis not present

## 2021-11-28 DIAGNOSIS — J9690 Respiratory failure, unspecified, unspecified whether with hypoxia or hypercapnia: Secondary | ICD-10-CM | POA: Diagnosis not present

## 2021-11-29 DIAGNOSIS — E039 Hypothyroidism, unspecified: Secondary | ICD-10-CM | POA: Diagnosis not present

## 2021-11-29 DIAGNOSIS — E1169 Type 2 diabetes mellitus with other specified complication: Secondary | ICD-10-CM | POA: Diagnosis not present

## 2021-11-29 DIAGNOSIS — K529 Noninfective gastroenteritis and colitis, unspecified: Secondary | ICD-10-CM | POA: Diagnosis not present

## 2021-11-29 DIAGNOSIS — I10 Essential (primary) hypertension: Secondary | ICD-10-CM | POA: Diagnosis not present

## 2021-11-29 DIAGNOSIS — I214 Non-ST elevation (NSTEMI) myocardial infarction: Secondary | ICD-10-CM | POA: Diagnosis not present

## 2021-12-01 ENCOUNTER — Encounter (INDEPENDENT_AMBULATORY_CARE_PROVIDER_SITE_OTHER): Payer: Medicare (Managed Care) | Admitting: Ophthalmology

## 2021-12-02 DIAGNOSIS — I10 Essential (primary) hypertension: Secondary | ICD-10-CM | POA: Diagnosis not present

## 2021-12-02 DIAGNOSIS — K529 Noninfective gastroenteritis and colitis, unspecified: Secondary | ICD-10-CM | POA: Diagnosis not present

## 2021-12-02 DIAGNOSIS — E039 Hypothyroidism, unspecified: Secondary | ICD-10-CM | POA: Diagnosis not present

## 2021-12-02 DIAGNOSIS — I214 Non-ST elevation (NSTEMI) myocardial infarction: Secondary | ICD-10-CM | POA: Diagnosis not present

## 2021-12-02 DIAGNOSIS — E1169 Type 2 diabetes mellitus with other specified complication: Secondary | ICD-10-CM | POA: Diagnosis not present

## 2021-12-05 DIAGNOSIS — E1169 Type 2 diabetes mellitus with other specified complication: Secondary | ICD-10-CM | POA: Diagnosis not present

## 2021-12-05 DIAGNOSIS — E039 Hypothyroidism, unspecified: Secondary | ICD-10-CM | POA: Diagnosis not present

## 2021-12-05 DIAGNOSIS — K529 Noninfective gastroenteritis and colitis, unspecified: Secondary | ICD-10-CM | POA: Diagnosis not present

## 2021-12-05 DIAGNOSIS — I214 Non-ST elevation (NSTEMI) myocardial infarction: Secondary | ICD-10-CM | POA: Diagnosis not present

## 2021-12-05 DIAGNOSIS — I10 Essential (primary) hypertension: Secondary | ICD-10-CM | POA: Diagnosis not present

## 2022-05-09 DIAGNOSIS — R7989 Other specified abnormal findings of blood chemistry: Secondary | ICD-10-CM | POA: Diagnosis not present

## 2022-05-09 DIAGNOSIS — Z1330 Encounter for screening examination for mental health and behavioral disorders, unspecified: Secondary | ICD-10-CM | POA: Diagnosis not present

## 2022-05-09 DIAGNOSIS — I10 Essential (primary) hypertension: Secondary | ICD-10-CM | POA: Diagnosis not present

## 2022-05-09 DIAGNOSIS — E7800 Pure hypercholesterolemia, unspecified: Secondary | ICD-10-CM | POA: Diagnosis not present

## 2022-05-09 DIAGNOSIS — E119 Type 2 diabetes mellitus without complications: Secondary | ICD-10-CM | POA: Diagnosis not present

## 2022-05-09 DIAGNOSIS — E039 Hypothyroidism, unspecified: Secondary | ICD-10-CM | POA: Diagnosis not present

## 2022-06-22 DIAGNOSIS — M9901 Segmental and somatic dysfunction of cervical region: Secondary | ICD-10-CM | POA: Diagnosis not present

## 2022-06-22 DIAGNOSIS — S134XXA Sprain of ligaments of cervical spine, initial encounter: Secondary | ICD-10-CM | POA: Diagnosis not present

## 2022-06-22 DIAGNOSIS — M9902 Segmental and somatic dysfunction of thoracic region: Secondary | ICD-10-CM | POA: Diagnosis not present

## 2022-06-26 DIAGNOSIS — M9902 Segmental and somatic dysfunction of thoracic region: Secondary | ICD-10-CM | POA: Diagnosis not present

## 2022-06-26 DIAGNOSIS — M9901 Segmental and somatic dysfunction of cervical region: Secondary | ICD-10-CM | POA: Diagnosis not present

## 2022-06-26 DIAGNOSIS — S134XXA Sprain of ligaments of cervical spine, initial encounter: Secondary | ICD-10-CM | POA: Diagnosis not present

## 2022-09-07 DIAGNOSIS — E7800 Pure hypercholesterolemia, unspecified: Secondary | ICD-10-CM | POA: Diagnosis not present

## 2022-09-07 DIAGNOSIS — E039 Hypothyroidism, unspecified: Secondary | ICD-10-CM | POA: Diagnosis not present

## 2022-09-07 DIAGNOSIS — N183 Chronic kidney disease, stage 3 unspecified: Secondary | ICD-10-CM | POA: Diagnosis not present

## 2022-09-07 DIAGNOSIS — I10 Essential (primary) hypertension: Secondary | ICD-10-CM | POA: Diagnosis not present

## 2022-09-07 DIAGNOSIS — E1122 Type 2 diabetes mellitus with diabetic chronic kidney disease: Secondary | ICD-10-CM | POA: Diagnosis not present

## 2022-11-28 DIAGNOSIS — J329 Chronic sinusitis, unspecified: Secondary | ICD-10-CM | POA: Diagnosis not present

## 2022-11-28 DIAGNOSIS — R918 Other nonspecific abnormal finding of lung field: Secondary | ICD-10-CM | POA: Diagnosis not present

## 2022-11-28 DIAGNOSIS — J40 Bronchitis, not specified as acute or chronic: Secondary | ICD-10-CM | POA: Diagnosis not present

## 2022-11-28 DIAGNOSIS — R0981 Nasal congestion: Secondary | ICD-10-CM | POA: Diagnosis not present

## 2022-11-28 DIAGNOSIS — R051 Acute cough: Secondary | ICD-10-CM | POA: Diagnosis not present

## 2022-11-28 DIAGNOSIS — Z8701 Personal history of pneumonia (recurrent): Secondary | ICD-10-CM | POA: Diagnosis not present

## 2023-01-08 DIAGNOSIS — R202 Paresthesia of skin: Secondary | ICD-10-CM | POA: Diagnosis not present

## 2023-01-08 DIAGNOSIS — E039 Hypothyroidism, unspecified: Secondary | ICD-10-CM | POA: Diagnosis not present

## 2023-01-08 DIAGNOSIS — N183 Chronic kidney disease, stage 3 unspecified: Secondary | ICD-10-CM | POA: Diagnosis not present

## 2023-01-08 DIAGNOSIS — E1122 Type 2 diabetes mellitus with diabetic chronic kidney disease: Secondary | ICD-10-CM | POA: Diagnosis not present

## 2023-01-08 DIAGNOSIS — Z1330 Encounter for screening examination for mental health and behavioral disorders, unspecified: Secondary | ICD-10-CM | POA: Diagnosis not present
